# Patient Record
Sex: Male | Born: 1945 | Race: White | Hispanic: No | Marital: Single | Smoking: Never smoker
Health system: Southern US, Community
[De-identification: ages and names within clinical notes are randomized; demographics above are authoritative.]

## PROBLEM LIST (undated history)

## (undated) DIAGNOSIS — I2510 Atherosclerotic heart disease of native coronary artery without angina pectoris: Secondary | ICD-10-CM

## (undated) DIAGNOSIS — I10 Essential (primary) hypertension: Secondary | ICD-10-CM

## (undated) HISTORY — PX: CORONARY ANGIOPLASTY WITH STENT PLACEMENT: SHX49

---

## 2020-03-03 ENCOUNTER — Other Ambulatory Visit: Payer: Self-pay

## 2020-03-03 ENCOUNTER — Observation Stay (HOSPITAL_COMMUNITY): Payer: Medicare HMO

## 2020-03-03 ENCOUNTER — Encounter (HOSPITAL_COMMUNITY): Payer: Self-pay | Admitting: Emergency Medicine

## 2020-03-03 ENCOUNTER — Emergency Department (HOSPITAL_COMMUNITY): Payer: Medicare HMO

## 2020-03-03 ENCOUNTER — Inpatient Hospital Stay (HOSPITAL_COMMUNITY)
Admission: EM | Admit: 2020-03-03 | Discharge: 2020-03-08 | DRG: 069 | Disposition: A | Discharge status: Home Health | Payer: Medicare HMO | Attending: Internal Medicine | Admitting: Internal Medicine

## 2020-03-03 DIAGNOSIS — I455 Other specified heart block: Secondary | ICD-10-CM | POA: Diagnosis not present

## 2020-03-03 DIAGNOSIS — R4701 Aphasia: Secondary | ICD-10-CM

## 2020-03-03 DIAGNOSIS — Z7982 Long term (current) use of aspirin: Secondary | ICD-10-CM

## 2020-03-03 DIAGNOSIS — Z20822 Contact with and (suspected) exposure to covid-19: Secondary | ICD-10-CM | POA: Diagnosis present

## 2020-03-03 DIAGNOSIS — R29810 Facial weakness: Secondary | ICD-10-CM | POA: Diagnosis present

## 2020-03-03 DIAGNOSIS — Z7902 Long term (current) use of antithrombotics/antiplatelets: Secondary | ICD-10-CM

## 2020-03-03 DIAGNOSIS — I129 Hypertensive chronic kidney disease with stage 1 through stage 4 chronic kidney disease, or unspecified chronic kidney disease: Secondary | ICD-10-CM | POA: Diagnosis present

## 2020-03-03 DIAGNOSIS — I639 Cerebral infarction, unspecified: Secondary | ICD-10-CM | POA: Diagnosis not present

## 2020-03-03 DIAGNOSIS — G459 Transient cerebral ischemic attack, unspecified: Principal | ICD-10-CM

## 2020-03-03 DIAGNOSIS — Z79899 Other long term (current) drug therapy: Secondary | ICD-10-CM

## 2020-03-03 DIAGNOSIS — Z955 Presence of coronary angioplasty implant and graft: Secondary | ICD-10-CM

## 2020-03-03 DIAGNOSIS — R29705 NIHSS score 5: Secondary | ICD-10-CM | POA: Diagnosis present

## 2020-03-03 DIAGNOSIS — N182 Chronic kidney disease, stage 2 (mild): Secondary | ICD-10-CM | POA: Diagnosis present

## 2020-03-03 DIAGNOSIS — Z7901 Long term (current) use of anticoagulants: Secondary | ICD-10-CM

## 2020-03-03 DIAGNOSIS — R001 Bradycardia, unspecified: Secondary | ICD-10-CM | POA: Diagnosis not present

## 2020-03-03 DIAGNOSIS — E876 Hypokalemia: Secondary | ICD-10-CM | POA: Diagnosis present

## 2020-03-03 DIAGNOSIS — R569 Unspecified convulsions: Secondary | ICD-10-CM | POA: Diagnosis present

## 2020-03-03 DIAGNOSIS — R2990 Unspecified symptoms and signs involving the nervous system: Secondary | ICD-10-CM | POA: Diagnosis not present

## 2020-03-03 DIAGNOSIS — Z8673 Personal history of transient ischemic attack (TIA), and cerebral infarction without residual deficits: Secondary | ICD-10-CM

## 2020-03-03 DIAGNOSIS — F10230 Alcohol dependence with withdrawal, uncomplicated: Secondary | ICD-10-CM | POA: Diagnosis not present

## 2020-03-03 DIAGNOSIS — I2510 Atherosclerotic heart disease of native coronary artery without angina pectoris: Secondary | ICD-10-CM | POA: Diagnosis present

## 2020-03-03 DIAGNOSIS — R7989 Other specified abnormal findings of blood chemistry: Secondary | ICD-10-CM | POA: Diagnosis present

## 2020-03-03 DIAGNOSIS — R4189 Other symptoms and signs involving cognitive functions and awareness: Secondary | ICD-10-CM | POA: Diagnosis present

## 2020-03-03 DIAGNOSIS — Z789 Other specified health status: Secondary | ICD-10-CM

## 2020-03-03 DIAGNOSIS — G8191 Hemiplegia, unspecified affecting right dominant side: Secondary | ICD-10-CM | POA: Diagnosis present

## 2020-03-03 HISTORY — DX: Essential (primary) hypertension: I10

## 2020-03-03 HISTORY — DX: Atherosclerotic heart disease of native coronary artery without angina pectoris: I25.10

## 2020-03-03 LAB — COMPREHENSIVE METABOLIC PANEL
ALT: 19 U/L (ref 0–44)
AST: 23 U/L (ref 15–41)
Albumin: 4.4 g/dL (ref 3.5–5.0)
Alkaline Phosphatase: 46 U/L (ref 38–126)
Anion gap: 13 (ref 5–15)
BUN: 22 mg/dL (ref 8–23)
CO2: 19 mmol/L — ABNORMAL LOW (ref 22–32)
Calcium: 9.6 mg/dL (ref 8.9–10.3)
Chloride: 112 mmol/L — ABNORMAL HIGH (ref 98–111)
Creatinine, Ser: 1.68 mg/dL — ABNORMAL HIGH (ref 0.61–1.24)
GFR calc Af Amer: 46 mL/min — ABNORMAL LOW (ref >60)
GFR calc non Af Amer: 40 mL/min — ABNORMAL LOW (ref >60)
Glucose, Bld: 143 mg/dL — ABNORMAL HIGH (ref 70–99)
Potassium: 3.4 mmol/L — ABNORMAL LOW (ref 3.5–5.1)
Sodium: 144 mmol/L (ref 135–145)
Total Bilirubin: 1.6 mg/dL — ABNORMAL HIGH (ref 0.3–1.2)
Total Protein: 7.2 g/dL (ref 6.5–8.1)

## 2020-03-03 LAB — LIPID PANEL
Cholesterol: 123 mg/dL (ref 0–200)
HDL: 54 mg/dL (ref >40)
LDL Cholesterol: 53 mg/dL (ref 0–99)
Total CHOL/HDL Ratio: 2.3 RATIO
Triglycerides: 79 mg/dL (ref <150)
VLDL: 16 mg/dL (ref 0–40)

## 2020-03-03 LAB — ACETAMINOPHEN LEVEL: Acetaminophen (Tylenol), Serum: <10 ug/mL — ABNORMAL LOW (ref 10–30)

## 2020-03-03 LAB — DIFFERENTIAL
Abs Immature Granulocytes: 0.03 10*3/uL (ref 0.00–0.07)
Basophils Absolute: 0 10*3/uL (ref 0.0–0.1)
Basophils Relative: 0 %
Eosinophils Absolute: 0.2 10*3/uL (ref 0.0–0.5)
Eosinophils Relative: 2 %
Immature Granulocytes: 0 %
Lymphocytes Relative: 10 %
Lymphs Abs: 0.9 10*3/uL (ref 0.7–4.0)
Monocytes Absolute: 0.8 10*3/uL (ref 0.1–1.0)
Monocytes Relative: 9 %
Neutro Abs: 7 10*3/uL (ref 1.7–7.7)
Neutrophils Relative %: 79 %

## 2020-03-03 LAB — I-STAT CHEM 8, ED
BUN: 23 mg/dL (ref 8–23)
Calcium, Ion: 1.17 mmol/L (ref 1.15–1.40)
Chloride: 113 mmol/L — ABNORMAL HIGH (ref 98–111)
Creatinine, Ser: 1.7 mg/dL — ABNORMAL HIGH (ref 0.61–1.24)
Glucose, Bld: 142 mg/dL — ABNORMAL HIGH (ref 70–99)
HCT: 39 % (ref 39.0–52.0)
Hemoglobin: 13.3 g/dL (ref 13.0–17.0)
Potassium: 3.4 mmol/L — ABNORMAL LOW (ref 3.5–5.1)
Sodium: 145 mmol/L (ref 135–145)
TCO2: 21 mmol/L — ABNORMAL LOW (ref 22–32)

## 2020-03-03 LAB — SALICYLATE LEVEL: Salicylate Lvl: <7 mg/dL — ABNORMAL LOW (ref 7.0–30.0)

## 2020-03-03 LAB — CBG MONITORING, ED: Glucose-Capillary: 133 mg/dL — ABNORMAL HIGH (ref 70–99)

## 2020-03-03 LAB — CBC
HCT: 43.2 % (ref 39.0–52.0)
Hemoglobin: 14.3 g/dL (ref 13.0–17.0)
MCH: 31.8 pg (ref 26.0–34.0)
MCHC: 33.1 g/dL (ref 30.0–36.0)
MCV: 96 fL (ref 80.0–100.0)
Platelets: 127 10*3/uL — ABNORMAL LOW (ref 150–400)
RBC: 4.5 MIL/uL (ref 4.22–5.81)
RDW: 13 % (ref 11.5–15.5)
WBC: 8.9 10*3/uL (ref 4.0–10.5)
nRBC: 0 % (ref 0.0–0.2)

## 2020-03-03 LAB — ECHOCARDIOGRAM COMPLETE
Height: 69 in
Weight: 3227.53 oz

## 2020-03-03 LAB — HEMOGLOBIN A1C
Hgb A1c MFr Bld: 5.6 % (ref 4.8–5.6)
Mean Plasma Glucose: 114.02 mg/dL

## 2020-03-03 LAB — URINALYSIS, ROUTINE W REFLEX MICROSCOPIC
Bilirubin Urine: NEGATIVE
Glucose, UA: NEGATIVE mg/dL
Hgb urine dipstick: NEGATIVE
Ketones, ur: NEGATIVE mg/dL
Leukocytes,Ua: NEGATIVE
Nitrite: NEGATIVE
Protein, ur: NEGATIVE mg/dL
Specific Gravity, Urine: 1.026 (ref 1.005–1.030)
pH: 5 (ref 5.0–8.0)

## 2020-03-03 LAB — PROTIME-INR
INR: 1 (ref 0.8–1.2)
Prothrombin Time: 13.1 seconds (ref 11.4–15.2)

## 2020-03-03 LAB — RAPID URINE DRUG SCREEN, HOSP PERFORMED
Amphetamines: NOT DETECTED
Barbiturates: NOT DETECTED
Benzodiazepines: POSITIVE — AB
Cocaine: NOT DETECTED
Opiates: NOT DETECTED
Tetrahydrocannabinol: NOT DETECTED

## 2020-03-03 LAB — ETHANOL: Alcohol, Ethyl (B): <10 mg/dL (ref <10)

## 2020-03-03 LAB — SARS CORONAVIRUS 2 BY RT PCR (HOSPITAL ORDER, PERFORMED IN ~~LOC~~ HOSPITAL LAB): SARS Coronavirus 2: NEGATIVE

## 2020-03-03 LAB — APTT: aPTT: 23 seconds — ABNORMAL LOW (ref 24–36)

## 2020-03-03 MED ORDER — ACETAMINOPHEN 160 MG/5ML PO SOLN: 650.0000 mg | ORAL | Status: DC | PRN

## 2020-03-03 MED ORDER — HYDRALAZINE HCL 25 MG PO TABS
25.0000 mg | ORAL_TABLET | Freq: Four times a day (QID) | ORAL | Status: DC
  Administered 2020-03-03 – 2020-03-04 (×4): 25 mg via ORAL
  Filled 2020-03-03 (×4): qty 1

## 2020-03-03 MED ORDER — HYDRALAZINE HCL 20 MG/ML IJ SOLN
10.0000 mg | Freq: Once | INTRAMUSCULAR | Status: AC
  Administered 2020-03-03: 10 mg via INTRAVENOUS
  Filled 2020-03-03: qty 1

## 2020-03-03 MED ORDER — ATROPINE SULFATE 1 MG/10ML IJ SOSY
0.5000 mg | PREFILLED_SYRINGE | Freq: Once | INTRAMUSCULAR | Status: DC
  Filled 2020-03-03: qty 10

## 2020-03-03 MED ORDER — ACETAMINOPHEN 650 MG RE SUPP: 650.0000 mg | RECTAL | Status: DC | PRN

## 2020-03-03 MED ORDER — IOHEXOL 350 MG/ML SOLN
75.0000 mL | Freq: Once | INTRAVENOUS | Status: AC | PRN
  Administered 2020-03-03: 75 mL via INTRAVENOUS

## 2020-03-03 MED ORDER — ATROPINE SULFATE 1 MG/10ML IJ SOSY
PREFILLED_SYRINGE | INTRAMUSCULAR | Status: AC
  Filled 2020-03-03: qty 10

## 2020-03-03 MED ORDER — AMLODIPINE BESYLATE 5 MG PO TABS
5.0000 mg | ORAL_TABLET | Freq: Every day | ORAL | Status: DC
  Administered 2020-03-03 – 2020-03-08 (×7): 5 mg via ORAL
  Filled 2020-03-03 (×3): qty 1
  Filled 2020-03-03: qty 2
  Filled 2020-03-03 (×3): qty 1

## 2020-03-03 MED ORDER — ACETAMINOPHEN 325 MG PO TABS: 650.0000 mg | ORAL_TABLET | ORAL | Status: DC | PRN

## 2020-03-03 MED ORDER — ATORVASTATIN CALCIUM 40 MG PO TABS
40.0000 mg | ORAL_TABLET | Freq: Every day | ORAL | Status: DC
  Administered 2020-03-03 – 2020-03-04 (×2): 40 mg via ORAL
  Filled 2020-03-03 (×2): qty 1

## 2020-03-03 MED ORDER — LEVETIRACETAM IN NACL 1000 MG/100ML IV SOLN
1000.0000 mg | Freq: Once | INTRAVENOUS | Status: AC
  Administered 2020-03-03: 1000 mg via INTRAVENOUS
  Filled 2020-03-03: qty 100

## 2020-03-03 MED ORDER — SODIUM CHLORIDE 0.9 % IV SOLN: INTRAVENOUS | Status: AC

## 2020-03-03 MED ORDER — ASPIRIN 300 MG RE SUPP: 300.0000 mg | Freq: Every day | RECTAL | Status: DC

## 2020-03-03 MED ORDER — ENOXAPARIN SODIUM 40 MG/0.4ML ~~LOC~~ SOLN
40.0000 mg | SUBCUTANEOUS | Status: DC
  Administered 2020-03-03 – 2020-03-08 (×6): 40 mg via SUBCUTANEOUS
  Filled 2020-03-03 (×6): qty 0.4

## 2020-03-03 MED ORDER — THIAMINE HCL 100 MG/ML IJ SOLN
500.0000 mg | Freq: Three times a day (TID) | INTRAVENOUS | Status: AC
  Administered 2020-03-03 – 2020-03-04 (×5): 500 mg via INTRAVENOUS
  Filled 2020-03-03 (×7): qty 5

## 2020-03-03 MED ORDER — STROKE: EARLY STAGES OF RECOVERY BOOK
Freq: Once | Status: DC
  Filled 2020-03-03: qty 1

## 2020-03-03 MED ORDER — ASPIRIN 325 MG PO TABS
325.0000 mg | ORAL_TABLET | Freq: Every day | ORAL | Status: DC
  Administered 2020-03-03 – 2020-03-04 (×2): 325 mg via ORAL
  Filled 2020-03-03 (×2): qty 1

## 2020-03-03 MED ORDER — LEVETIRACETAM IN NACL 500 MG/100ML IV SOLN
500.0000 mg | Freq: Two times a day (BID) | INTRAVENOUS | Status: DC
  Administered 2020-03-03 – 2020-03-05 (×4): 500 mg via INTRAVENOUS
  Filled 2020-03-03 (×5): qty 100

## 2020-03-03 MED ORDER — THIAMINE HCL 100 MG/ML IJ SOLN
Freq: Once | INTRAVENOUS | Status: AC
  Filled 2020-03-03: qty 1000

## 2020-03-03 MED ORDER — GLUCAGON HCL RDNA (DIAGNOSTIC) 1 MG IJ SOLR
5.0000 mg | Freq: Once | INTRAMUSCULAR | Status: DC
  Filled 2020-03-03: qty 5

## 2020-03-03 NOTE — Progress Notes (Signed)
See note by Dr. Laurence Slate from earlier today. Presented with aphasia which has now resolved, but he still has a right facial droop. Will get repeat diffusion imaging, stroke workup underway.   Ritta Slot, MD [facility] [phone]  If 7pm- 7am, please page neurology on call as listed in AMION.

## 2020-03-03 NOTE — ED Notes (Signed)
Cardiologist at the bedside

## 2020-03-03 NOTE — ED Notes (Signed)
Pt transported to echo 
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
## 2020-03-03 NOTE — ED Notes (Signed)
PT at bedside.

## 2020-03-03 NOTE — H&P (Signed)
History and Physical    David HauserBruce Brummond ZOX:[id number] DOB: June 07, 1946 DOA: 03/03/2020  PCP: Patient, No Pcp Per  Patient coming from: Home  I have personally briefly [facility]  Chief Complaint: AMS  HPI: David Cherry is a 74 y.o. male with medical history significant of CAD s/p stent, HTN.  Pt brought in to ED after [place] [address] with confusion, aphasia, R sided weakness.  Some coumadin found in his car.  Per patient hasnt been taking med for the past month.  History from patient not really great at this point due to a mixed wernickie+broca aphasia.  For example when a word is used with the patient he will start randomly putting that word into sentences: ie "I live in coumadin".  Makes history taking very challenging.  Family doesn't know much about his medical history when ED called them.   ED Course: In ED pt seen by neurology as a code stroke, not TPAed due to unk last well.  MRI brain neg for acute stroke findings.  Per Dr. Laurence SlateAroor consider TIA vs seizure with post-ictal state.  Review of Systems: Cant really get an effective ROS due to aphasia.  Past Medical History:  Diagnosis Date  . Coronary artery disease   . Hypertension     Past Surgical History:  Procedure Laterality Date  . CORONARY ANGIOPLASTY WITH STENT PLACEMENT       reports that he has never smoked. He has never used smokeless tobacco. He reports that he does not drink alcohol or use drugs.  No Known Allergies  History reviewed. No pertinent family history.   Prior to Admission medications   Not on File    Physical Exam: Vitals:   03/03/20 0215 03/03/20 0245 03/03/20 0300 03/03/20 0315  BP: (!) 157/93 129/78 117/80 (!) 169/95  Pulse: 71 (!) 33 95 77  Resp: 13 19 19  (!) 23  Temp:      TempSrc:      SpO2: 100% 97% 96% 99%  Weight:      Height:        Constitutional: NAD, calm, comfortable Eyes: PERRL, lids and conjunctivae  normal ENMT: Mucous membranes are moist. Posterior pharynx clear of any exudate or lesions.Normal dentition.  Neck: normal, supple, no masses, no thyromegaly Respiratory: clear to auscultation bilaterally, no wheezing, no crackles. Normal respiratory effort. No accessory muscle use.  Cardiovascular: IRR, IRR with significant pauses.  Worse when pt asleep. Abdomen: no tenderness, no masses palpated. No hepatosplenomegaly. Bowel sounds positive.  Musculoskeletal: no clubbing / cyanosis. No joint deformity upper and lower extremities. Good ROM, no contractures. Normal muscle tone.  Skin: no rashes, lesions, ulcers. No induration Neurologic: R sided weakness.  Mixed broca's and wernickie's aphasia. Psychiatric: Knows hes in a hospital, oriented to self,   Labs on Admission: I have personally reviewed following labs and imaging studies  CBC: Recent Labs  Lab 03/03/20 0057 03/03/20 0102  WBC 8.9  --   NEUTROABS 7.0  --   HGB 14.3 13.3  HCT 43.2 39.0  MCV 96.0  --   PLT 127*  --    Basic Metabolic Panel: Recent Labs  Lab 03/03/20 0057 03/03/20 0102  NA 144 145  K 3.4* 3.4*  CL 112* 113*  CO2 19*  --   GLUCOSE 143* 142*  BUN 22 23  CREATININE 1.68* 1.70*  CALCIUM 9.6  --    GFR: Estimated Creatinine Clearance: 43.9 mL/min (A) (by  C-G formula based on SCr of 1.7 mg/dL (H)). Liver Function Tests: Recent Labs  Lab 03/03/20 0057  AST 23  ALT 19  ALKPHOS 46  BILITOT 1.6*  PROT 7.2  ALBUMIN 4.4   No results for input(s): LIPASE, AMYLASE in the last 168 hours. No results for input(s): AMMONIA in the last 168 hours. Coagulation Profile: Recent Labs  Lab 03/03/20 0057  INR 1.0   Cardiac Enzymes: No results for input(s): CKTOTAL, CKMB, CKMBINDEX, TROPONINI in the last 168 hours. BNP (last 3 results) No results for input(s): PROBNP in the last 8760 hours. HbA1C: No results for input(s): HGBA1C in the last 72 hours. CBG: Recent Labs  Lab 03/03/20 0053  GLUCAP 133*     Lipid Profile: No results for input(s): CHOL, HDL, LDLCALC, TRIG, CHOLHDL, LDLDIRECT in the last 72 hours. Thyroid Function Tests: No results for input(s): TSH, T4TOTAL, FREET4, T3FREE, THYROIDAB in the last 72 hours. Anemia Panel: No results for input(s): VITAMINB12, FOLATE, FERRITIN, TIBC, IRON, RETICCTPCT in the last 72 hours. Urine analysis:    Component Value Date/Time   COLORURINE YELLOW 03/03/2020 0048   APPEARANCEUR CLEAR 03/03/2020 0048   LABSPEC 1.026 03/03/2020 0048   PHURINE 5.0 03/03/2020 0048   GLUCOSEU NEGATIVE 03/03/2020 0048   HGBUR NEGATIVE 03/03/2020 0048   BILIRUBINUR NEGATIVE 03/03/2020 0048   KETONESUR NEGATIVE 03/03/2020 0048   PROTEINUR NEGATIVE 03/03/2020 0048   NITRITE NEGATIVE 03/03/2020 0048   LEUKOCYTESUR NEGATIVE 03/03/2020 0048    Radiological Exams on Admission: CT ANGIO HEAD W OR WO CONTRAST  Result Date: 03/03/2020 CLINICAL DATA:  Right-sided weakness EXAM: CT ANGIOGRAPHY HEAD AND NECK TECHNIQUE: Multidetector CT imaging of the head and neck was performed using the standard protocol during bolus administration of intravenous contrast. Multiplanar CT image reconstructions and MIPs were obtained to evaluate the vascular anatomy. Carotid stenosis measurements (when applicable) are obtained utilizing NASCET criteria, using the distal internal carotid diameter as the denominator. CONTRAST:  109mL OMNIPAQUE IOHEXOL 350 MG/ML SOLN COMPARISON:  None. FINDINGS: CTA NECK FINDINGS SKELETON: There is no bony spinal canal stenosis. No lytic or blastic lesion. OTHER NECK: Normal pharynx, larynx and major salivary glands. No cervical lymphadenopathy. Unremarkable thyroid gland. UPPER CHEST: No pneumothorax or pleural effusion. No nodules or masses. AORTIC ARCH: There is no calcific atherosclerosis of the aortic arch. There is no aneurysm, dissection or hemodynamically significant stenosis of the visualized portion of the aorta. Conventional 3 vessel aortic branching  pattern. The visualized proximal subclavian arteries are widely patent. RIGHT CAROTID SYSTEM: No dissection, occlusion or aneurysm. Mild atherosclerotic calcification at the carotid bifurcation without hemodynamically significant stenosis. LEFT CAROTID SYSTEM: No dissection, occlusion or aneurysm. Mild atherosclerotic calcification at the carotid bifurcation without hemodynamically significant stenosis. VERTEBRAL ARTERIES: Codominant configuration. Both origins are clearly patent. There is no dissection, occlusion or flow-limiting stenosis to the skull base (V1-V3 segments). CTA HEAD FINDINGS POSTERIOR CIRCULATION: --Vertebral arteries: Normal V4 segments. --Inferior cerebellar arteries: Normal. --Basilar artery: Normal. --Superior cerebellar arteries: Normal. --Posterior cerebral arteries (PCA): Normal. ANTERIOR CIRCULATION: --Intracranial internal carotid arteries: Atherosclerotic calcification of the internal carotid arteries at the skull base without hemodynamically significant stenosis. --Anterior cerebral arteries (ACA): Normal. Absent right A1 segment, normal variant --Middle cerebral arteries (MCA): Normal. VENOUS SINUSES: As permitted by contrast timing, patent. ANATOMIC VARIANTS: None Review of the MIP images confirms the above findings. IMPRESSION: 1. No emergent large vessel occlusion or hemodynamically significant stenosis of the head or neck. 2. Mild bilateral carotid bifurcation atherosclerosis without hemodynamically significant stenosis. Electronically  Signed   By: Ulyses Jarred M.D.   On: 03/03/2020 01:30   CT ANGIO NECK W OR WO CONTRAST  Result Date: 03/03/2020 CLINICAL DATA:  Right-sided weakness EXAM: CT ANGIOGRAPHY HEAD AND NECK TECHNIQUE: Multidetector CT imaging of the head and neck was performed using the standard protocol during bolus administration of intravenous contrast. Multiplanar CT image reconstructions and MIPs were obtained to evaluate the vascular anatomy. Carotid stenosis  measurements (when applicable) are obtained utilizing NASCET criteria, using the distal internal carotid diameter as the denominator. CONTRAST:  24mL OMNIPAQUE IOHEXOL 350 MG/ML SOLN COMPARISON:  None. FINDINGS: CTA NECK FINDINGS SKELETON: There is no bony spinal canal stenosis. No lytic or blastic lesion. OTHER NECK: Normal pharynx, larynx and major salivary glands. No cervical lymphadenopathy. Unremarkable thyroid gland. UPPER CHEST: No pneumothorax or pleural effusion. No nodules or masses. AORTIC ARCH: There is no calcific atherosclerosis of the aortic arch. There is no aneurysm, dissection or hemodynamically significant stenosis of the visualized portion of the aorta. Conventional 3 vessel aortic branching pattern. The visualized proximal subclavian arteries are widely patent. RIGHT CAROTID SYSTEM: No dissection, occlusion or aneurysm. Mild atherosclerotic calcification at the carotid bifurcation without hemodynamically significant stenosis. LEFT CAROTID SYSTEM: No dissection, occlusion or aneurysm. Mild atherosclerotic calcification at the carotid bifurcation without hemodynamically significant stenosis. VERTEBRAL ARTERIES: Codominant configuration. Both origins are clearly patent. There is no dissection, occlusion or flow-limiting stenosis to the skull base (V1-V3 segments). CTA HEAD FINDINGS POSTERIOR CIRCULATION: --Vertebral arteries: Normal V4 segments. --Inferior cerebellar arteries: Normal. --Basilar artery: Normal. --Superior cerebellar arteries: Normal. --Posterior cerebral arteries (PCA): Normal. ANTERIOR CIRCULATION: --Intracranial internal carotid arteries: Atherosclerotic calcification of the internal carotid arteries at the skull base without hemodynamically significant stenosis. --Anterior cerebral arteries (ACA): Normal. Absent right A1 segment, normal variant --Middle cerebral arteries (MCA): Normal. VENOUS SINUSES: As permitted by contrast timing, patent. ANATOMIC VARIANTS: None Review of the  MIP images confirms the above findings. IMPRESSION: 1. No emergent large vessel occlusion or hemodynamically significant stenosis of the head or neck. 2. Mild bilateral carotid bifurcation atherosclerosis without hemodynamically significant stenosis. Electronically Signed   By: Ulyses Jarred M.D.   On: 03/03/2020 01:30   MR BRAIN WO CONTRAST  Result Date: 03/03/2020 CLINICAL DATA:  Dysphagia and right-sided weakness EXAM: MRI HEAD WITHOUT CONTRAST TECHNIQUE: Multiplanar, multiecho pulse sequences of the brain and surrounding structures were obtained without intravenous contrast. COMPARISON:  CTA head neck same day FINDINGS: BRAIN: No acute infarct, acute hemorrhage or extra-axial collection. Multifocal white matter hyperintensity, most commonly due to chronic ischemic microangiopathy. Multiple old small vessel infarcts of the radiata. Normal volume of CSF spaces. No chronic microhemorrhage. Normal midline structures. VASCULAR: Major flow voids are preserved. SKULL AND UPPER CERVICAL SPINE: Normal calvarium and skull base. Visualized upper cervical spine and soft tissues are normal. SINUSES/ORBITS: Right mastoid fluid. Paranasal sinuses are clear. Normal orbits. IMPRESSION: 1. No acute intracranial process. 2. 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 Electronically Signed   By: Ulyses Jarred M.D.   On: 03/03/2020 02:15   DG Chest Portable 1 View  Result Date: 03/03/2020 CLINICAL DATA:  Altered mental status EXAM: PORTABLE CHEST 1 VIEW COMPARISON:  None. FINDINGS: No focal consolidation, features of edema, pneumothorax, or effusion. The aorta is calcified. The remaining cardiomediastinal contours are unremarkable. No acute osseous or soft tissue abnormality. Degenerative changes are present in the imaged spine and shoulders. Telemetry leads overlie the chest. IMPRESSION: No acute cardiopulmonary abnormality. Electronically Signed  By: Kreg Shropshire M.D.   On:  03/03/2020 02:36   CT HEAD CODE STROKE WO CONTRAST  Result Date: 03/03/2020 CLINICAL DATA:  Code stroke.  Right-sided weakness and aphasia EXAM: CT HEAD WITHOUT CONTRAST TECHNIQUE: Contiguous axial images were obtained from the base of the skull through the vertex without intravenous contrast. COMPARISON:  None. FINDINGS: Brain: There is no mass, hemorrhage or extra-axial collection. There is generalized atrophy without lobar predilection. There is hypoattenuation of the periventricular white matter, most commonly indicating chronic ischemic microangiopathy. 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 Vascular: No abnormal hyperdensity of the major intracranial arteries or dural venous sinuses. No intracranial atherosclerosis. Skull: The visualized skull base, calvarium and extracranial soft tissues are normal. Sinuses/Orbits: No fluid levels or advanced mucosal thickening of the visualized paranasal sinuses. No mastoid or middle ear effusion. The orbits are normal. ASPECTS [facility] Stroke Program Early CT Score) - Ganglionic level infarction (caudate, lentiform nuclei, internal capsule, insula, M1-M3 cortex): 7 - Supraganglionic infarction (M4-M6 cortex): 3 Total score (0-10 with 10 being normal): 10 IMPRESSION: 1. No acute hemorrhage. 2. Possible subacute left cerebellar infarct. 3. ASPECTS is 10. These results were communicated to Dr. Georgiana Spinner Aroor at 1:04 am on 03/03/2020 by text page via the [facility] messaging system. Electronically Signed   By: Deatra Robinson M.D.   On: 03/03/2020 01:04    EKG: Independently reviewed.  Assessment/Plan Principal Problem:   Stroke-like symptoms Active Problems:   CAD (coronary artery disease)   Chronic anticoagulation   Bradycardia    1. Stroke like symptoms - 1. Stroke / TIA pathway 2. See neuro consult note 3. 2d echo 4. Tele monitor 5. PT/OT/SLP 6. Start Lipitor 40 (either for TIA, or for the h/o CAD s/p stent). 7. ASA 325 8. Per neuro: start empiric keppra  since seizure is a possibility. 9. No reported h/o EtOH abuse, but will also do high dose thiamine, just-in-case there is an element of wernickie-korsakoff that we are missing: so 500mg  thiamine IV TID for 2 days ordered. 2. Bradycardia / sinus pauses - 1. Gets very severe when patient is asleep, seems less severe when we woke patient up. 2. HR will drop into the 30s while asleep 3. Unknown what meds he is on chronically, BB? Etc. 4. Though when not having pauses will have HR almost up to 100. 5. Dr. going to see pt. 3. Chronic anticoagulation - 1. Sounds like he was on coumadin, for some reason, up until about 1 month ago when he stopped taking coumadin, unclear why this was stopped. 2. INR 1.0 today 4. CAD - 1. Pt denies chest pain today 2. Med rec still pending. 3. ASA 325 for stroke 4. Lipitor 40 being started  DVT prophylaxis: Lovenox Code Status: Full Family Communication: No family in room Disposition Plan: TBD Consults called: Dr. Algie Coffer, Dr. Laurence Slate Admission status: Place in obs    Skylie Hiott M. DO [facility]  How to contact the [facility] Attending or Consulting provider 7A - 7P or covering provider during after hours 7P -7A, for this patient?  1. Check the care team in [facility] and look for a) attending/consulting [facility] provider listed and b) the [facility] team listed 2. Log into [email or web address]  Amion Physician Scheduling and messaging for groups and whole hospitals  On call and physician scheduling software for group practices, residents, hospitalists and other medical providers for call, clinic, rotation and shift schedules. [employer] is a hospital-wide system for scheduling doctors and paging doctors on call.  EasyPlot is for scientific plotting and data analysis.  [email or web address]  and use [facility]'s universal password to access. If you do not have the password, please contact the hospital operator.  3. Locate the [facility] provider you are looking for under [facility] and page to a number that you can be directly reached. 4. If you still have difficulty reaching the provider, please page the [facility] (Director on Call) for the Hospitalists listed on amion for assistance.  03/03/2020, 3:32 AM

## 2020-03-03 NOTE — Progress Notes (Signed)
Good LV systolic function. VS are stable with improved heart rate and blood pressure controlled. Patient wants to see his Cardiologist in [place], [place] area. Mild confusion persist. Increase activity in AM for possible discharge.  Orpah Cobb, MD.

## 2020-03-03 NOTE — ED Notes (Signed)
Pt transported to MRI 

## 2020-03-03 NOTE — ED Provider Notes (Addendum)
[facility] EMERGENCY DEPARTMENT Provider Note   CSN: [id number] Arrival date & time: 03/03/20  0050     History Chief Complaint  Patient presents with  . Code Stroke    David Cherry is a 74 y.o. male.  The history is provided by the EMS personnel. The history is limited by the condition of the patient.  Cerebrovascular Accident This is a new problem. Episode onset: unknown. The problem occurs constantly. The problem has not changed since onset.Nothing aggravates the symptoms. Nothing relieves the symptoms. He has tried nothing for the symptoms. The treatment provided moderate relief.  Found on side of 87 with aphasia and confusion and right facial droop.       History reviewed. No pertinent past medical history.  There are no problems to display for this patient.   History reviewed. No pertinent surgical history.     History reviewed. No pertinent family history.  Social History   Tobacco Use  . Smoking status: Not on file  Substance Use Topics  . Alcohol use: Not on file  . Drug use: Not on file    Home Medications Prior to Admission medications   Not on File    Allergies    Patient has no allergy information on record.  Review of Systems   Review of Systems  Unable to perform ROS: Acuity of condition  Constitutional: Negative for fever.  Neurological: Positive for facial asymmetry and speech difficulty.    Physical Exam Updated Vital Signs There were no vitals taken for this visit.  Physical Exam Vitals and nursing note reviewed.  Constitutional:      General: He is not in acute distress.    Appearance: Normal appearance.  HENT:     Head: Normocephalic and atraumatic.     Nose: Nose normal.     Mouth/Throat:     Mouth: Mucous membranes are moist.     Pharynx: Oropharynx is clear.  Eyes:     Extraocular Movements: Extraocular movements intact.     Conjunctiva/sclera: Conjunctivae normal.     Pupils: Pupils are equal,  round, and reactive to light.  Cardiovascular:     Rate and Rhythm: Normal rate and regular rhythm.     Pulses: Normal pulses.     Heart sounds: Normal heart sounds.  Pulmonary:     Effort: Pulmonary effort is normal.     Breath sounds: Normal breath sounds.  Abdominal:     General: Abdomen is flat. Bowel sounds are normal.     Tenderness: There is no abdominal tenderness. There is no guarding.  Musculoskeletal:        General: Normal range of motion.     Cervical back: Normal range of motion and neck supple.  Skin:    General: Skin is warm and dry.     Capillary Refill: Capillary refill takes less than 2 seconds.  Neurological:     Mental Status: He is alert.     Deep Tendon Reflexes: Reflexes normal.     Comments: Expressive aphasia mild right mouth droop     ED Results / Procedures / Treatments   Labs (all labs ordered are listed, but only abnormal results are displayed) Results for orders placed or performed during the hospital encounter of 03/03/20  SARS Coronavirus 2 by RT PCR (hospital order, performed in [facility] hospital lab) Nasopharyngeal Nasopharyngeal Swab   Specimen: Nasopharyngeal Swab  Result Value Ref Range   SARS Coronavirus 2 NEGATIVE NEGATIVE  Ethanol  Result  Value Ref Range   Alcohol, Ethyl (B) <10 <10 mg/dL  Protime-INR  Result Value Ref Range   Prothrombin Time 13.1 11.4 - 15.2 seconds   INR 1.0 0.8 - 1.2  APTT  Result Value Ref Range   aPTT 23 (L) 24 - 36 seconds  CBC  Result Value Ref Range   WBC 8.9 4.0 - 10.5 K/uL   RBC 4.50 4.22 - 5.81 MIL/uL   Hemoglobin 14.3 13.0 - 17.0 g/dL   HCT 30.1 60.1 - 09.3 %   MCV 96.0 80.0 - 100.0 fL   MCH 31.8 26.0 - 34.0 pg   MCHC 33.1 30.0 - 36.0 g/dL   RDW 23.5 57.3 - 22.0 %   Platelets 127 (L) 150 - 400 K/uL   nRBC 0.0 0.0 - 0.2 %  Differential  Result Value Ref Range   Neutrophils Relative % 79 %   Neutro Abs 7.0 1.7 - 7.7 K/uL   Lymphocytes Relative 10 %   Lymphs Abs 0.9 0.7 - 4.0 K/uL    Monocytes Relative 9 %   Monocytes Absolute 0.8 0.1 - 1.0 K/uL   Eosinophils Relative 2 %   Eosinophils Absolute 0.2 0.0 - 0.5 K/uL   Basophils Relative 0 %   Basophils Absolute 0.0 0.0 - 0.1 K/uL   Immature Granulocytes 0 %   Abs Immature Granulocytes 0.03 0.00 - 0.07 K/uL  Comprehensive metabolic panel  Result Value Ref Range   Sodium 144 135 - 145 mmol/L   Potassium 3.4 (L) 3.5 - 5.1 mmol/L   Chloride 112 (H) 98 - 111 mmol/L   CO2 19 (L) 22 - 32 mmol/L   Glucose, Bld 143 (H) 70 - 99 mg/dL   BUN 22 8 - 23 mg/dL   Creatinine, Ser 2.54 (H) 0.61 - 1.24 mg/dL   Calcium 9.6 8.9 - 27.0 mg/dL   Total Protein 7.2 6.5 - 8.1 g/dL   Albumin 4.4 3.5 - 5.0 g/dL   AST 23 15 - 41 U/L   ALT 19 0 - 44 U/L   Alkaline Phosphatase 46 38 - 126 U/L   Total Bilirubin 1.6 (H) 0.3 - 1.2 mg/dL   GFR calc non Af Amer 40 (L) >60 mL/min   GFR calc Af Amer 46 (L) >60 mL/min   Anion gap 13 5 - 15  Urinalysis, Routine w reflex microscopic  Result Value Ref Range   Color, Urine YELLOW YELLOW   APPearance CLEAR CLEAR   Specific Gravity, Urine 1.026 1.005 - 1.030   pH 5.0 5.0 - 8.0   Glucose, UA NEGATIVE NEGATIVE mg/dL   Hgb urine dipstick NEGATIVE NEGATIVE   Bilirubin Urine NEGATIVE NEGATIVE   Ketones, ur NEGATIVE NEGATIVE mg/dL   Protein, ur NEGATIVE NEGATIVE mg/dL   Nitrite NEGATIVE NEGATIVE   Leukocytes,Ua NEGATIVE NEGATIVE  Acetaminophen level  Result Value Ref Range   Acetaminophen (Tylenol), Serum <10 (L) 10 - 30 ug/mL  Salicylate level  Result Value Ref Range   Salicylate Lvl <7.0 (L) 7.0 - 30.0 mg/dL  I-stat chem 8, ED  Result Value Ref Range   Sodium 145 135 - 145 mmol/L   Potassium 3.4 (L) 3.5 - 5.1 mmol/L   Chloride 113 (H) 98 - 111 mmol/L   BUN 23 8 - 23 mg/dL   Creatinine, Ser 6.23 (H) 0.61 - 1.24 mg/dL   Glucose, Bld 762 (H) 70 - 99 mg/dL   Calcium, Ion 8.31 5.17 - 1.40 mmol/L   TCO2 21 (L) 22 - 32 mmol/L  Hemoglobin 13.3 13.0 - 17.0 g/dL   HCT 53.2 99.2 - 42.6 %  CBG  monitoring, ED  Result Value Ref Range   Glucose-Capillary 133 (H) 70 - 99 mg/dL   Comment 1 Notify RN    Comment 2 Document in Chart    CT ANGIO HEAD W OR WO CONTRAST  Result Date: 03/03/2020 CLINICAL DATA:  Right-sided weakness EXAM: CT ANGIOGRAPHY HEAD AND NECK TECHNIQUE: Multidetector CT imaging of the head and neck was performed using the standard protocol during bolus administration of intravenous contrast. Multiplanar CT image reconstructions and MIPs were obtained to evaluate the vascular anatomy. Carotid stenosis measurements (when applicable) are obtained utilizing NASCET criteria, using the distal internal carotid diameter as the denominator. CONTRAST:  1mL OMNIPAQUE IOHEXOL 350 MG/ML SOLN COMPARISON:  None. FINDINGS: CTA NECK FINDINGS SKELETON: There is no bony spinal canal stenosis. No lytic or blastic lesion. OTHER NECK: Normal pharynx, larynx and major salivary glands. No cervical lymphadenopathy. Unremarkable thyroid gland. UPPER CHEST: No pneumothorax or pleural effusion. No nodules or masses. AORTIC ARCH: There is no calcific atherosclerosis of the aortic arch. There is no aneurysm, dissection or hemodynamically significant stenosis of the visualized portion of the aorta. Conventional 3 vessel aortic branching pattern. The visualized proximal subclavian arteries are widely patent. RIGHT CAROTID SYSTEM: No dissection, occlusion or aneurysm. Mild atherosclerotic calcification at the carotid bifurcation without hemodynamically significant stenosis. LEFT CAROTID SYSTEM: No dissection, occlusion or aneurysm. Mild atherosclerotic calcification at the carotid bifurcation without hemodynamically significant stenosis. VERTEBRAL ARTERIES: Codominant configuration. Both origins are clearly patent. There is no dissection, occlusion or flow-limiting stenosis to the skull base (V1-V3 segments). CTA HEAD FINDINGS POSTERIOR CIRCULATION: --Vertebral arteries: Normal V4 segments. --Inferior cerebellar  arteries: Normal. --Basilar artery: Normal. --Superior cerebellar arteries: Normal. --Posterior cerebral arteries (PCA): Normal. ANTERIOR CIRCULATION: --Intracranial internal carotid arteries: Atherosclerotic calcification of the internal carotid arteries at the skull base without hemodynamically significant stenosis. --Anterior cerebral arteries (ACA): Normal. Absent right A1 segment, normal variant --Middle cerebral arteries (MCA): Normal. VENOUS SINUSES: As permitted by contrast timing, patent. ANATOMIC VARIANTS: None Review of the MIP images confirms the above findings. IMPRESSION: 1. No emergent large vessel occlusion or hemodynamically significant stenosis of the head or neck. 2. Mild bilateral carotid bifurcation atherosclerosis without hemodynamically significant stenosis. Electronically Signed   By: Deatra Robinson M.D.   On: 03/03/2020 01:30   CT ANGIO NECK W OR WO CONTRAST  Result Date: 03/03/2020 CLINICAL DATA:  Right-sided weakness EXAM: CT ANGIOGRAPHY HEAD AND NECK TECHNIQUE: Multidetector CT imaging of the head and neck was performed using the standard protocol during bolus administration of intravenous contrast. Multiplanar CT image reconstructions and MIPs were obtained to evaluate the vascular anatomy. Carotid stenosis measurements (when applicable) are obtained utilizing NASCET criteria, using the distal internal carotid diameter as the denominator. CONTRAST:  18mL OMNIPAQUE IOHEXOL 350 MG/ML SOLN COMPARISON:  None. FINDINGS: CTA NECK FINDINGS SKELETON: There is no bony spinal canal stenosis. No lytic or blastic lesion. OTHER NECK: Normal pharynx, larynx and major salivary glands. No cervical lymphadenopathy. Unremarkable thyroid gland. UPPER CHEST: No pneumothorax or pleural effusion. No nodules or masses. AORTIC ARCH: There is no calcific atherosclerosis of the aortic arch. There is no aneurysm, dissection or hemodynamically significant stenosis of the visualized portion of the aorta.  Conventional 3 vessel aortic branching pattern. The visualized proximal subclavian arteries are widely patent. RIGHT CAROTID SYSTEM: No dissection, occlusion or aneurysm. Mild atherosclerotic calcification at the carotid bifurcation without hemodynamically significant stenosis. LEFT CAROTID SYSTEM:  No dissection, occlusion or aneurysm. Mild atherosclerotic calcification at the carotid bifurcation without hemodynamically significant stenosis. VERTEBRAL ARTERIES: Codominant configuration. Both origins are clearly patent. There is no dissection, occlusion or flow-limiting stenosis to the skull base (V1-V3 segments). CTA HEAD FINDINGS POSTERIOR CIRCULATION: --Vertebral arteries: Normal V4 segments. --Inferior cerebellar arteries: Normal. --Basilar artery: Normal. --Superior cerebellar arteries: Normal. --Posterior cerebral arteries (PCA): Normal. ANTERIOR CIRCULATION: --Intracranial internal carotid arteries: Atherosclerotic calcification of the internal carotid arteries at the skull base without hemodynamically significant stenosis. --Anterior cerebral arteries (ACA): Normal. Absent right A1 segment, normal variant --Middle cerebral arteries (MCA): Normal. VENOUS SINUSES: As permitted by contrast timing, patent. ANATOMIC VARIANTS: None Review of the MIP images confirms the above findings. IMPRESSION: 1. No emergent large vessel occlusion or hemodynamically significant stenosis of the head or neck. 2. Mild bilateral carotid bifurcation atherosclerosis without hemodynamically significant stenosis. Electronically Signed   By: Deatra RobinsonKevin  Herman M.D.   On: 03/03/2020 01:30   MR BRAIN WO CONTRAST  Result Date: 03/03/2020 CLINICAL DATA:  Dysphagia and right-sided weakness EXAM: MRI HEAD WITHOUT CONTRAST TECHNIQUE: Multiplanar, multiecho pulse sequences of the brain and surrounding structures were obtained without intravenous contrast. COMPARISON:  CTA head neck same day FINDINGS: BRAIN: No acute infarct, acute hemorrhage or  extra-axial collection. Multifocal white matter hyperintensity, most commonly due to chronic ischemic microangiopathy. Multiple old small vessel infarcts of the radiata. Normal volume of CSF spaces. No chronic microhemorrhage. Normal midline structures. VASCULAR: Major flow voids are preserved. SKULL AND UPPER CERVICAL SPINE: Normal calvarium and skull base. Visualized upper cervical spine and soft tissues are normal. SINUSES/ORBITS: Right mastoid fluid. Paranasal sinuses are clear. Normal orbits. IMPRESSION: 1. No acute intracranial process. 2. 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 Electronically Signed   By: Deatra RobinsonKevin  Herman M.D.   On: 03/03/2020 02:15   CT HEAD CODE STROKE WO CONTRAST  Result Date: 03/03/2020 CLINICAL DATA:  Code stroke.  Right-sided weakness and aphasia EXAM: CT HEAD WITHOUT CONTRAST TECHNIQUE: Contiguous axial images were obtained from the base of the skull through the vertex without intravenous contrast. COMPARISON:  None. FINDINGS: Brain: There is no mass, hemorrhage or extra-axial collection. There is generalized atrophy without lobar predilection. There is hypoattenuation of the periventricular white matter, most commonly indicating chronic ischemic microangiopathy. 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 Vascular: No abnormal hyperdensity of the major intracranial arteries or dural venous sinuses. No intracranial atherosclerosis. Skull: The visualized skull base, calvarium and extracranial soft tissues are normal. Sinuses/Orbits: No fluid levels or advanced mucosal thickening of the visualized paranasal sinuses. No mastoid or middle ear effusion. The orbits are normal. ASPECTS [facility]([place] Stroke Program Early CT Score) - Ganglionic level infarction (caudate, lentiform nuclei, internal capsule, insula, M1-M3 cortex): 7 - Supraganglionic infarction (M4-M6 cortex): 3 Total score (0-10 with 10 being normal): 10 IMPRESSION: 1. No acute hemorrhage.  2. Possible subacute left cerebellar infarct. 3. ASPECTS is 10. These results were communicated to Dr. Georgiana SpinnerSushanth Aroor at 1:04 am on 03/03/2020 by text page via the [facility] messaging system. Electronically Signed   By: Deatra RobinsonKevin  Herman M.D.   On: 03/03/2020 01:04    EKG See read in MUse and epic  No results found.  Procedures Procedures (including critical care time)   ED Course  I have reviewed the triage vital signs and the nursing notes.  Pertinent labs & imaging results that were available during my care of the patient were reviewed by me and considered in my medical decision making (  see chart for details).    Case d/w Dr. Laurence Slate of neurology.  As symptoms are improving likely a TIA, admit to medicine.     Sinus pause while asleep.  Patient awoken and HR improved as did pauses,    Case D/w Dr. Algie Coffer who will see the patient   Final Clinical Impression(s) / ED Diagnoses Will admit to medicine    Ventura Leggitt, MD 03/03/20 2956    Nicanor Alcon, Marvia Troost, MD 03/03/20 2130

## 2020-03-03 NOTE — Progress Notes (Signed)
03/03/20 1807  PT Visit Information  Assistance Needed +1  History of Present Illness Pt is a 74 year old man admitted on 03/03/20 after [place] confused with R side weakness, facial droop and aphasia. 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 PMH: HTN, CAD   Precautions  Precautions Fall  Restrictions  Weight Bearing Restrictions No  Home Living  Family/patient expects to be discharged to: Private residence  Living Arrangements Alone  Available Help at Discharge Other (Comment) (reports no one)  Type of Home Other(Comment) (condo)  Home Access Level entry  Home Layout One level  Community education officer - 2 wheels;Cane - single point  Prior Function  Level of Independence Independent with assistive device(s)  Comments Used cane or RW for mobility   Communication  Communication No difficulties  Pain Assessment  Pain Assessment Faces  Faces Pain Scale 0  Cognition  Arousal/Alertness Awake/alert  Behavior During Therapy WFL for tasks assessed/performed  Overall Cognitive Status Impaired/Different from baseline  Area of Impairment Orientation;Attention;Memory;Following commands;Safety/judgement;Awareness;Problem solving  Orientation Level Disoriented to;Place;Time;Situation (aware he was found by police along highway)  Current Attention Level Focused  Memory Decreased short-term memory  Following Commands Follows one step commands with increased time;Follows multi-step commands inconsistently  Safety/Judgement Decreased awareness of safety;Decreased awareness of deficits  Awareness Intellectual  Problem Solving Decreased initiation;Requires verbal cues  General Comments [facility] Very tangential. Difficulty finding words.   Upper Extremity Assessment  Upper Extremity Assessment Defer to OT evaluation  Lower Extremity  Assessment  Lower Extremity Assessment Generalized weakness  Cervical / Trunk Assessment  Cervical / Trunk Assessment Kyphotic  Bed Mobility  Overal bed mobility Needs Assistance  Bed Mobility Supine to Sit;Sit to Supine  Supine to sit Min assist;HOB elevated  Sit to supine Supervision  General bed mobility comments Min A for trunk assist. Increased time required.   Transfers  Overall transfer level Needs assistance  Equipment used Rolling walker (2 wheeled)  Transfers Sit to/from Stand  Sit to Stand Min guard  General transfer comment pulls up on walker, min guard for safety  Ambulation/Gait  Ambulation/Gait assistance Min assist;Min guard  Gait Distance (Feet) 75 Feet  Assistive device Rolling walker (2 wheeled)  Gait Pattern/deviations Step-through pattern;Decreased stride length  General Gait Details Unsteady gait with notable functional weakness. Pt requiring safety cues for RW use and cues for proximity to device.   Gait velocity Decreased  Balance  Overall balance assessment Needs assistance  Sitting-balance support No upper extremity supported  Sitting balance-Leahy Scale Good  Standing balance support Bilateral upper extremity supported;During functional activity  Standing balance-Leahy Scale Poor  Standing balance comment reliant on B UEs  PT - End of Session  Equipment Utilized During Treatment Gait belt  Activity Tolerance Patient tolerated treatment well  Patient left in bed;with call bell/phone within reach  Nurse Communication Mobility status  PT Assessment  PT Recommendation/Assessment Patient needs continued PT services  PT Visit Diagnosis Unsteadiness on feet (R26.81);Muscle weakness (generalized) (M62.81)  PT Problem List Decreased strength;Decreased balance;Decreased mobility;Decreased cognition;Decreased knowledge of use of DME;Decreased safety awareness;Decreased knowledge of precautions  Barriers to Discharge Decreased caregiver support  PT Plan  PT  Frequency (ACUTE ONLY) Min 2X/week  PT Treatment/Interventions (ACUTE ONLY) Gait training;DME instruction;Therapeutic activities;Functional mobility training;Therapeutic exercise;Balance training;Patient/family education;Cognitive remediation  AM-PAC PT "6 Clicks" Mobility Outcome Measure (Version 2)  Help needed turning from your back to your side while in a flat bed without using bedrails? 3  Help needed moving from lying on your back to sitting on the side of a flat bed without using bedrails? 3  Help needed moving to and from a bed to a chair (including a wheelchair)? 3  Help needed standing up from a chair using your arms (e.g., wheelchair or bedside chair)? 3  Help needed to walk in hospital room? 3  Help needed climbing 3-5 steps with a railing?  2  6 Click Score 17  Consider Recommendation of Discharge To: Home with [facility]  PT Recommendation  Follow Up Recommendations SNF;Supervision/Assistance - 24 hour  PT equipment None recommended by PT  Individuals Consulted  Consulted and Agree with Results and Recommendations Patient  Acute Rehab PT Goals  Patient Stated Goal go home and feed his cat, "Max"  PT Goal Formulation With patient  Time For Goal Achievement 03/17/20  Potential to Achieve Goals Good  PT Time Calculation  PT Start Time (ACUTE ONLY) 1347  PT Stop Time (ACUTE ONLY) 1402  PT Time Calculation (min) (ACUTE ONLY) 15 min  PT General Charges  $$ ACUTE PT VISIT 1 Visit  PT Evaluation  $PT Eval Moderate Complexity 1 Mod  Written Expression  Dominant Hand Right   Pt admitted secondary to problem above with deficits above. Pt with increased confusion and word finding difficulty. Also with poor safety awareness. Requires min A for gait with use of RW. Required safety cues throughout. Pt currently lives alone and feel he is at increased risk for falls. Recommend SNF level therapies at d/c. Will continue to follow acutely to maximize functional mobility independence and safety.    Reuel Derby, PT, DPT  Acute Rehabilitation Services  Pager: [phone] Office: [phone]

## 2020-03-03 NOTE — ED Notes (Signed)
Paged Dr Santiago Glad to Dr Karel Jarvis

## 2020-03-03 NOTE — Progress Notes (Signed)
PROGRESS NOTE    David Cherry  FOY:[id number] DOB: [birth year] DOA: 03/03/2020 PCP: Patient, No Pcp Per    Chief Complaint  Patient presents with   Code Stroke    Brief Narrative:  HPI per Dr. Ronnie Derby is a 74 y.o. male with medical history significant of CAD s/p stent, HTN.  Pt brought in [place] ED [place] with confusion, aphasia, R sided weakness.  Some coumadin found in his car.  Per patient hasnt been taking med for the past month.  History from patient not really great at this point due to a mixed wernickie+broca aphasia.  For example when a word is used with the patient he will start randomly putting that word into sentences: ie "I live in coumadin".  Makes history taking very challenging.  Family doesn't know much about his medical history when ED called them.   ED Course: In ED pt seen by neurology as a code stroke, not TPAed due to unk last well.  MRI brain neg for acute stroke findings.  Per Dr. Laurence Slate consider TIA vs seizure with post-ictal state.  Assessment & Plan:   Principal Problem:   Stroke-like symptoms Active Problems:   CAD (coronary artery disease)   Chronic anticoagulation   Bradycardia  1 rule out CVA/strokelike symptoms Patient presenting with right-sided weakness, right facial droop and noted to be aphasic.  Patient with improvement with aphasia as well as facial droop.  Head CT done on admission negative for any acute abnormalities.  CT angiogram head and neck which was done was negative for any large vessel occlusion.  tPA not given as patient unclear as to last known normal.  MRI which was done in intracranial process, multiple old small vessel infarcts and findings of mild chronic small vessel disease noted.  2D echo ordered with EF of 55 to 60%, no wall motion abnormalities, grade 1 diastolic dysfunction, no source of emboli noted.  2D echo however did note a grade 2 atheroma plaque involving the  aortic root and ascending aorta.  Fasting lipid panel with LDL of 53.  Neurology consulted and following.  Patient loaded with Keppra due to concerns for possible seizure versus post ictal state.  EEG also ordered and done which showed a normal study with no seizures or epileptiform discharges seen throughout the recording.  Continue aspirin, statin.  Permissive hypertension.  PT/OT/ST.  Continue IV thiamine 500 mg every 8 hours x2 days.  Neurology followed up later on and recommending repeat diffusion imaging.  Appreciate neurology input and recommendations.  Follow.  2.  Bradycardia/sinus pauses Patient noted on presentation to the ED to have some severe bradycardia when asleep and less severe when patient was awake.  It was noted her heart rate would drop into the 30s while patient was asleep.  Unknown as to what specific medications patient is on in terms of a nodal blocking agents.  It was noted that when patient not having pauses heart rate went almost up to 100.  Cardiology consulted and patient seen in consultation by Dr. Algie Coffer.  2D echo was ordered with normal EF of 55 to 60%, no wall motion abnormalities, grade 1 diastolic dysfunction.  Per cardiology.  3.  Hypertension Continue Norvasc and hydralazine.  4.  Chronic anticoagulation/coronary artery disease 2D echo done with a EF of 55 to 60%, no wall motion abnormalities, grade 1 diastolic dysfunction.  Patient denies any ongoing chest pain.  It was noted that patient was  on anticoagulation with Coumadin for up until a month ago however noted to have stopped taking Coumadin.  INR at 1.0.  Continue aspirin, Lipitor, hydralazine, Norvasc.  Cardiology following.  5.  Elevated creatinine Creatinine noted to be approximately 1.68 on presentation.  Creatinine earlier this morning around 1.70.  Patient on gentle hydration.  If no improvement with gentle hydration will check a renal ultrasound.  Follow.      DVT prophylaxis: Lovenox Code Status:  Full Family Communication: Updated patient.  No family at bedside. Disposition:   Status is: Observation    Dispo: The patient is from: Home              Anticipated d/c is to: Home              Anticipated d/c date is: In the next 1 to 2 days.              Patient currently undergoing CVA work-up and work-up for bradycardia.  Cardiology and neurology following.  Not ready for discharge.  Patient admitted early this morning.       Consultants:   Cardiology: Dr. Algie Coffer 03/03/2020  Neurology: Dr.Aroor 03/03/2020  Procedures:   CT head 03/03/2020  2D echo 03/03/2020  MRI 03/03/2020  Chest x-ray 03/03/2020  CT angiogram head and neck 03/03/2020  EEG 03/03/2020  Antimicrobials:  None   Subjective: Patient on gurney in the ED.  Patient stating he has been sitting in urine for a couple of hours now.  Denies any chest pain.  No shortness of breath.  States he is feeling better.  Was wondering when he will be discharged.  Patient states he saw the neurologist who wants to repeat MRI of his brain more limited focus version per patient.  Objective: Vitals:   03/03/20 1115 03/03/20 1215 03/03/20 1315 03/03/20 1334  BP: (!) 121/95   (!) 150/83  Pulse:  (!) 51 65   Resp:  12 17   Temp:      TempSrc:      SpO2: 94% 98% 96%   Weight:      Height:       No intake or output data in the 24 hours ending 03/03/20 1400 Filed Weights   03/03/20 0101  Weight: 91.5 kg    Examination:  General exam: Appears calm and comfortable  Respiratory system: Clear to auscultation. Respiratory effort normal. Cardiovascular system: Bradycardia.  No JVD, no murmurs rubs or gallops.  No lower extremity edema.  Gastrointestinal system: Abdomen is nondistended, soft and nontender. No organomegaly or masses felt. Normal bowel sounds heard. Central nervous system: Alert and oriented.  Mild right facial weakness/droop.  Moving extremities spontaneously.  Extremities: Symmetric 5 x 5 power. Skin: No  rashes, lesions or ulcers Psychiatry: Judgement and insight appear fair. Mood & affect appropriate.     Data Reviewed: I have personally reviewed following labs and imaging studies  CBC: Recent Labs  Lab 03/03/20 0057 03/03/20 0102  WBC 8.9  --   NEUTROABS 7.0  --   HGB 14.3 13.3  HCT 43.2 39.0  MCV 96.0  --   PLT 127*  --     Basic Metabolic Panel: Recent Labs  Lab 03/03/20 0057 03/03/20 0102  NA 144 145  K 3.4* 3.4*  CL 112* 113*  CO2 19*  --   GLUCOSE 143* 142*  BUN 22 23  CREATININE 1.68* 1.70*  CALCIUM 9.6  --     GFR: Estimated Creatinine Clearance: 43.9 mL/min (  A) (by C-G formula based on SCr of 1.7 mg/dL (H)).  Liver Function Tests: Recent Labs  Lab 03/03/20 0057  AST 23  ALT 19  ALKPHOS 46  BILITOT 1.6*  PROT 7.2  ALBUMIN 4.4    CBG: Recent Labs  Lab 03/03/20 0053  GLUCAP 133*     Recent Results (from the past 240 hour(s))  SARS Coronavirus 2 by RT PCR (hospital order, performed in [facility] hospital lab) Nasopharyngeal Nasopharyngeal Swab     Status: None   Collection Time: 03/03/20  1:24 AM   Specimen: Nasopharyngeal Swab  Result Value Ref Range Status   SARS Coronavirus 2 NEGATIVE NEGATIVE Final    Comment: (NOTE) SARS-CoV-2 target nucleic acids are NOT DETECTED. The SARS-CoV-2 RNA is generally detectable in upper and lower respiratory specimens during the acute phase of infection. The lowest concentration of SARS-CoV-2 viral copies this assay can detect is 250 copies / mL. A negative result does not preclude SARS-CoV-2 infection and should not be used as the sole basis for treatment or other patient management decisions.  A negative result may occur with improper specimen collection / handling, submission of specimen other than nasopharyngeal swab, presence of viral mutation(s) within the areas targeted by this assay, and inadequate number of viral copies (<250 copies / mL). A negative result must be combined with  clinical observations, patient history, and epidemiological information. Fact Sheet for Patients:   BoilerBrush.com.cyhttps://www.fda.gov/media/136312/download Fact Sheet for Healthcare Providers: https://pope.com/https://www.fda.gov/media/136313/download This test is not yet approved or cleared  by the [place] FDA and has been authorized for detection and/or diagnosis of SARS-CoV-2 by FDA under an Emergency Use Authorization (EUA).  This EUA will remain in effect (meaning this test can be used) for the duration of the COVID-19 declaration under Section 564(b)(1) of the Act, 21 U.S.C. section 360bbb-3(b)(1), unless the authorization is terminated or revoked sooner. Performed at [facility], [address] [place], [place] [address]          Radiology Studies: EEG  Result Date: 03/03/2020 Charlsie QuestYadav, Priyanka O, MD     03/03/2020 10:06 AM Patient Name: David Cherry MRN: [id number] Epilepsy Attending: Charlsie QuestPriyanka O Yadav Referring Physician/Provider: Dr. Georgiana SpinnerSushanth Aroor Date: 03/03/2020 Duration: 25.57 minutes Patient history: 74 year old male who presented with aphasia concerning for seizures.  EEG evaluate for seizures. Level of alertness: Awake, drowsy, sleep, comatose, lethargic AEDs during EEG study: Keppra Technical aspects: This EEG study was done with scalp electrodes positioned according to the 10-20 International system of electrode placement. Electrical activity was acquired at a sampling rate of 500Hz  and reviewed with a high frequency filter of 70Hz  and a low frequency filter of 1Hz . EEG data were recorded continuously and digitally stored. Description: The posterior dominant rhythm consists of 9 Hz activity of moderate voltage (25-35 uV) seen predominantly in posterior head regions, symmetric and reactive to eye opening and eye closing.  Hyperventilation and photic stimulation were not performed.   IMPRESSION: This study is within normal limits. No seizures or epileptiform discharges were seen throughout the  recording. Priyanka Annabelle Harman Yadav   CT ANGIO HEAD W OR WO CONTRAST  Result Date: 03/03/2020 CLINICAL DATA:  Right-sided weakness EXAM: CT ANGIOGRAPHY HEAD AND NECK TECHNIQUE: Multidetector CT imaging of the head and neck was performed using the standard protocol during bolus administration of intravenous contrast. Multiplanar CT image reconstructions and MIPs were obtained to evaluate the vascular anatomy. Carotid stenosis measurements (when applicable) are obtained utilizing NASCET criteria, using the distal internal carotid  diameter as the denominator. CONTRAST:  76mL OMNIPAQUE IOHEXOL 350 MG/ML SOLN COMPARISON:  None. FINDINGS: CTA NECK FINDINGS SKELETON: There is no bony spinal canal stenosis. No lytic or blastic lesion. OTHER NECK: Normal pharynx, larynx and major salivary glands. No cervical lymphadenopathy. Unremarkable thyroid gland. UPPER CHEST: No pneumothorax or pleural effusion. No nodules or masses. AORTIC ARCH: There is no calcific atherosclerosis of the aortic arch. There is no aneurysm, dissection or hemodynamically significant stenosis of the visualized portion of the aorta. Conventional 3 vessel aortic branching pattern. The visualized proximal subclavian arteries are widely patent. RIGHT CAROTID SYSTEM: No dissection, occlusion or aneurysm. Mild atherosclerotic calcification at the carotid bifurcation without hemodynamically significant stenosis. LEFT CAROTID SYSTEM: No dissection, occlusion or aneurysm. Mild atherosclerotic calcification at the carotid bifurcation without hemodynamically significant stenosis. VERTEBRAL ARTERIES: Codominant configuration. Both origins are clearly patent. There is no dissection, occlusion or flow-limiting stenosis to the skull base (V1-V3 segments). CTA HEAD FINDINGS POSTERIOR CIRCULATION: --Vertebral arteries: Normal V4 segments. --Inferior cerebellar arteries: Normal. --Basilar artery: Normal. --Superior cerebellar arteries: Normal. --Posterior cerebral arteries  (PCA): Normal. ANTERIOR CIRCULATION: --Intracranial internal carotid arteries: Atherosclerotic calcification of the internal carotid arteries at the skull base without hemodynamically significant stenosis. --Anterior cerebral arteries (ACA): Normal. Absent right A1 segment, normal variant --Middle cerebral arteries (MCA): Normal. VENOUS SINUSES: As permitted by contrast timing, patent. ANATOMIC VARIANTS: None Review of the MIP images confirms the above findings. IMPRESSION: 1. No emergent large vessel occlusion or hemodynamically significant stenosis of the head or neck. 2. Mild bilateral carotid bifurcation atherosclerosis without hemodynamically significant stenosis. Electronically Signed   By: Deatra Robinson M.D.   On: 03/03/2020 01:30   CT ANGIO NECK W OR WO CONTRAST  Result Date: 03/03/2020 CLINICAL DATA:  Right-sided weakness EXAM: CT ANGIOGRAPHY HEAD AND NECK TECHNIQUE: Multidetector CT imaging of the head and neck was performed using the standard protocol during bolus administration of intravenous contrast. Multiplanar CT image reconstructions and MIPs were obtained to evaluate the vascular anatomy. Carotid stenosis measurements (when applicable) are obtained utilizing NASCET criteria, using the distal internal carotid diameter as the denominator. CONTRAST:  13mL OMNIPAQUE IOHEXOL 350 MG/ML SOLN COMPARISON:  None. FINDINGS: CTA NECK FINDINGS SKELETON: There is no bony spinal canal stenosis. No lytic or blastic lesion. OTHER NECK: Normal pharynx, larynx and major salivary glands. No cervical lymphadenopathy. Unremarkable thyroid gland. UPPER CHEST: No pneumothorax or pleural effusion. No nodules or masses. AORTIC ARCH: There is no calcific atherosclerosis of the aortic arch. There is no aneurysm, dissection or hemodynamically significant stenosis of the visualized portion of the aorta. Conventional 3 vessel aortic branching pattern. The visualized proximal subclavian arteries are widely patent. RIGHT  CAROTID SYSTEM: No dissection, occlusion or aneurysm. Mild atherosclerotic calcification at the carotid bifurcation without hemodynamically significant stenosis. LEFT CAROTID SYSTEM: No dissection, occlusion or aneurysm. Mild atherosclerotic calcification at the carotid bifurcation without hemodynamically significant stenosis. VERTEBRAL ARTERIES: Codominant configuration. Both origins are clearly patent. There is no dissection, occlusion or flow-limiting stenosis to the skull base (V1-V3 segments). CTA HEAD FINDINGS POSTERIOR CIRCULATION: --Vertebral arteries: Normal V4 segments. --Inferior cerebellar arteries: Normal. --Basilar artery: Normal. --Superior cerebellar arteries: Normal. --Posterior cerebral arteries (PCA): Normal. ANTERIOR CIRCULATION: --Intracranial internal carotid arteries: Atherosclerotic calcification of the internal carotid arteries at the skull base without hemodynamically significant stenosis. --Anterior cerebral arteries (ACA): Normal. Absent right A1 segment, normal variant --Middle cerebral arteries (MCA): Normal. VENOUS SINUSES: As permitted by contrast timing, patent. ANATOMIC VARIANTS: None Review of the MIP images confirms the above  findings. IMPRESSION: 1. No emergent large vessel occlusion or hemodynamically significant stenosis of the head or neck. 2. Mild bilateral carotid bifurcation atherosclerosis without hemodynamically significant stenosis. Electronically Signed   By: Ulyses Jarred M.D.   On: 03/03/2020 01:30   MR BRAIN WO CONTRAST  Result Date: 03/03/2020 CLINICAL DATA:  Dysphagia and right-sided weakness EXAM: MRI HEAD WITHOUT CONTRAST TECHNIQUE: Multiplanar, multiecho pulse sequences of the brain and surrounding structures were obtained without intravenous contrast. COMPARISON:  CTA head neck same day FINDINGS: BRAIN: No acute infarct, acute hemorrhage or extra-axial collection. Multifocal white matter hyperintensity, most commonly due to chronic ischemic microangiopathy.  Multiple old small vessel infarcts of the radiata. Normal volume of CSF spaces. No chronic microhemorrhage. Normal midline structures. VASCULAR: Major flow voids are preserved. SKULL AND UPPER CERVICAL SPINE: Normal calvarium and skull base. Visualized upper cervical spine and soft tissues are normal. SINUSES/ORBITS: Right mastoid fluid. Paranasal sinuses are clear. Normal orbits. IMPRESSION: 1. No acute intracranial process. 2. 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 Electronically Signed   By: Ulyses Jarred M.D.   On: 03/03/2020 02:15   DG Chest Portable 1 View  Result Date: 03/03/2020 CLINICAL DATA:  Altered mental status EXAM: PORTABLE CHEST 1 VIEW COMPARISON:  None. FINDINGS: No focal consolidation, features of edema, pneumothorax, or effusion. The aorta is calcified. The remaining cardiomediastinal contours are unremarkable. No acute osseous or soft tissue abnormality. Degenerative changes are present in the imaged spine and shoulders. Telemetry leads overlie the chest. IMPRESSION: No acute cardiopulmonary abnormality. Electronically Signed   By: Lovena Le M.D.   On: 03/03/2020 02:36   CT HEAD CODE STROKE WO CONTRAST  Result Date: 03/03/2020 CLINICAL DATA:  Code stroke.  Right-sided weakness and aphasia EXAM: CT HEAD WITHOUT CONTRAST TECHNIQUE: Contiguous axial images were obtained from the base of the skull through the vertex without intravenous contrast. COMPARISON:  None. FINDINGS: Brain: There is no mass, hemorrhage or extra-axial collection. There is generalized atrophy without lobar predilection. There is hypoattenuation of the periventricular white matter, most commonly indicating chronic ischemic microangiopathy. 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 Vascular: No abnormal hyperdensity of the major intracranial arteries or dural venous sinuses. No intracranial atherosclerosis. Skull: The visualized skull base, calvarium and extracranial soft  tissues are normal. Sinuses/Orbits: No fluid levels or advanced mucosal thickening of the visualized paranasal sinuses. No mastoid or middle ear effusion. The orbits are normal. ASPECTS [facility] Stroke Program Early CT Score) - Ganglionic level infarction (caudate, lentiform nuclei, internal capsule, insula, M1-M3 cortex): 7 - Supraganglionic infarction (M4-M6 cortex): 3 Total score (0-10 with 10 being normal): 10 IMPRESSION: 1. No acute hemorrhage. 2. Possible subacute left cerebellar infarct. 3. ASPECTS is 10. These results were communicated to Dr. Karena Addison Aroor at 1:04 am on 03/03/2020 by text page via the [facility] messaging system. Electronically Signed   By: Ulyses Jarred M.D.   On: 03/03/2020 01:04        Scheduled Meds:   stroke: mapping our early stages of recovery book   Does not apply Once   amLODipine  5 mg Oral Daily   aspirin  300 mg Rectal Daily   Or   aspirin  325 mg Oral Daily   atorvastatin  40 mg Oral Daily   atropine  0.5 mg Intravenous Once   atropine       enoxaparin (LOVENOX) injection  40 mg Subcutaneous Q24H   glucagon (human recombinant)  5 mg Intravenous Once   hydrALAZINE  25 mg Oral Q6H   Continuous Infusions:  levETIRAcetam     thiamine injection 500 mg (03/03/20 1347)     LOS: 1 day    Time spent: 35 minutes  No charge    Ramiro Harvest, MD [facility]   To contact the attending provider between 7A-7P or the covering provider during after hours 7P-7A, please log into the web site [email or web address] and access using universal [place] password for that web site. If you do not have the password, please call the hospital operator.  03/03/2020, 2:00 PM

## 2020-03-03 NOTE — ED Notes (Signed)
Dr. Nicanor Alcon arrive to the EMS bay to assess pt, pt arrive 00:50 and clear by Dr. Nicanor Alcon to CT 00:53.

## 2020-03-03 NOTE — ED Notes (Signed)
Called Dr Algie Coffer to Dr Karel Jarvis

## 2020-03-03 NOTE — Consult Note (Addendum)
Requesting Physician: Dr. Nicanor Alcon    Chief Complaint:   History obtained from: Patient and Chart     HPI:                                                                                                                                       David Cherry is a 74 y.o. male with past medical history significant for coronary artery disease status post stents, on Coumadin for unclear reason presents to [facility] emergency department as a code stroke for aphasia.  Last known normal is unclear-estimated to be 1 pm based on his last phone call.  EMS was called after passerby witnessed patient roaming on the road around 11 PM.  When EMS arrived patient had mild right-sided weakness, right facial droop and was aphasic.  Patient arrived to [facility], patient remained aphasic however was improving according to EMS.  Had mild right facial droop but no longer had right upper extremity drift.  Stat CT head was obtained which was unremarkable and CT angiogram did not show LVO.  tPA was not given as last known normal unclear-was taken for stat MRI for consideration of giving TPA the patient had DWI/Flair  Mismatch-currently DWI restriction lesion C   History obtained by speaking with brother over the phone who lives in the [place] (other close family member is a sister in [place]).  They are not aware of his medical problems apart from that he had a stent placed and he is on blood thinner.  Patient states that he stopped taking Coumadin about a month ago.  Patient lives in [place] by himself and at baseline for independent  Date last known well: 6.8.21 Time last known well: Unknown tPA Given: No, outside TPA window NIHSS: 5 Baseline MRS 0     Allergies: Not on File   Medications:                                                                                                                        I reviewed home medications   ROS:  14 systems reviewed and negative except above    Examination:                                                                                                      General: Appears well-developed . Psych: Affect appropriate to situation Eyes: No scleral injection HENT: No OP obstrucion Head: Normocephalic.  Cardiovascular: Normal rate and regular rhythm.  Respiratory: Effort normal and breath sounds normal to anterior ascultation GI: Soft.  No distension. There is no tenderness.  Skin: WDI    Neurological Examination Mental Status: Alert, oriented to himself but not age or month, thought content appropriate.  Difficulty naming objects, answering questions, paraphasic errors.  Able to simple commands without difficulty. Cranial Nerves: II: Visual fields grossly normal,  III,IV, VI: ptosis not present, extra-ocular motions intact bilaterally, pupils equal, round, reactive to light and accommodation V,VII: Mild right facial droop, facial light touch sensation normal bilaterally VIII: hearing normal bilaterally IX,X: uvula rises symmetrically XI: bilateral shoulder shrug XII: midline tongue extension Motor: Right : Upper extremity   5/5    Left:     Upper extremity   5/5  Lower extremity   5/5     Lower extremity   5/5 Tone and bulk:normal tone throughout; no atrophy noted Sensory: Pinprick and light touch intact throughout, bilaterally Deep Tendon Reflexes: 2+ and symmetric throughout Plantars: Right: downgoing   Left: downgoing Cerebellar: normal finger-to-nose, normal rapid alternating movements and normal heel-to-shin test Gait: normal gait and station     Lab Results: Basic Metabolic Panel: Recent Labs  Lab 03/03/20 0057 03/03/20 0102  NA 144 145  K 3.4* 3.4*  CL 112* 113*  CO2 19*  --   GLUCOSE 143* 142*  BUN 22 23  CREATININE 1.68* 1.70*  CALCIUM 9.6  --     CBC: Recent Labs  Lab 03/03/20 0057  03/03/20 0102  WBC 8.9  --   NEUTROABS 7.0  --   HGB 14.3 13.3  HCT 43.2 39.0  MCV 96.0  --   PLT 127*  --     Coagulation Studies: Recent Labs    03/03/20 0057  LABPROT 13.1  INR 1.0    Imaging: CT ANGIO HEAD W OR WO CONTRAST  Result Date: 03/03/2020 CLINICAL DATA:  Right-sided weakness EXAM: CT ANGIOGRAPHY HEAD AND NECK TECHNIQUE: Multidetector CT imaging of the head and neck was performed using the standard protocol during bolus administration of intravenous contrast. Multiplanar CT image reconstructions and MIPs were obtained to evaluate the vascular anatomy. Carotid stenosis measurements (when applicable) are obtained utilizing NASCET criteria, using the distal internal carotid diameter as the denominator. CONTRAST:  39mL OMNIPAQUE IOHEXOL 350 MG/ML SOLN COMPARISON:  None. FINDINGS: CTA NECK FINDINGS SKELETON: There is no bony spinal canal stenosis. No lytic or blastic lesion. OTHER NECK: Normal pharynx, larynx and major salivary glands. No cervical lymphadenopathy. Unremarkable thyroid gland. UPPER CHEST: No pneumothorax or pleural effusion. No nodules or masses. AORTIC ARCH: There is no calcific atherosclerosis of the aortic arch. There is no aneurysm, dissection or hemodynamically significant stenosis of the  visualized portion of the aorta. Conventional 3 vessel aortic branching pattern. The visualized proximal subclavian arteries are widely patent. RIGHT CAROTID SYSTEM: No dissection, occlusion or aneurysm. Mild atherosclerotic calcification at the carotid bifurcation without hemodynamically significant stenosis. LEFT CAROTID SYSTEM: No dissection, occlusion or aneurysm. Mild atherosclerotic calcification at the carotid bifurcation without hemodynamically significant stenosis. VERTEBRAL ARTERIES: Codominant configuration. Both origins are clearly patent. There is no dissection, occlusion or flow-limiting stenosis to the skull base (V1-V3 segments). CTA HEAD FINDINGS POSTERIOR  CIRCULATION: --Vertebral arteries: Normal V4 segments. --Inferior cerebellar arteries: Normal. --Basilar artery: Normal. --Superior cerebellar arteries: Normal. --Posterior cerebral arteries (PCA): Normal. ANTERIOR CIRCULATION: --Intracranial internal carotid arteries: Atherosclerotic calcification of the internal carotid arteries at the skull base without hemodynamically significant stenosis. --Anterior cerebral arteries (ACA): Normal. Absent right A1 segment, normal variant --Middle cerebral arteries (MCA): Normal. VENOUS SINUSES: As permitted by contrast timing, patent. ANATOMIC VARIANTS: None Review of the MIP images confirms the above findings. IMPRESSION: 1. No emergent large vessel occlusion or hemodynamically significant stenosis of the head or neck. 2. Mild bilateral carotid bifurcation atherosclerosis without hemodynamically significant stenosis. Electronically Signed   By: Deatra Robinson M.D.   On: 03/03/2020 01:30   CT ANGIO NECK W OR WO CONTRAST  Result Date: 03/03/2020 CLINICAL DATA:  Right-sided weakness EXAM: CT ANGIOGRAPHY HEAD AND NECK TECHNIQUE: Multidetector CT imaging of the head and neck was performed using the standard protocol during bolus administration of intravenous contrast. Multiplanar CT image reconstructions and MIPs were obtained to evaluate the vascular anatomy. Carotid stenosis measurements (when applicable) are obtained utilizing NASCET criteria, using the distal internal carotid diameter as the denominator. CONTRAST:  89mL OMNIPAQUE IOHEXOL 350 MG/ML SOLN COMPARISON:  None. FINDINGS: CTA NECK FINDINGS SKELETON: There is no bony spinal canal stenosis. No lytic or blastic lesion. OTHER NECK: Normal pharynx, larynx and major salivary glands. No cervical lymphadenopathy. Unremarkable thyroid gland. UPPER CHEST: No pneumothorax or pleural effusion. No nodules or masses. AORTIC ARCH: There is no calcific atherosclerosis of the aortic arch. There is no aneurysm, dissection or  hemodynamically significant stenosis of the visualized portion of the aorta. Conventional 3 vessel aortic branching pattern. The visualized proximal subclavian arteries are widely patent. RIGHT CAROTID SYSTEM: No dissection, occlusion or aneurysm. Mild atherosclerotic calcification at the carotid bifurcation without hemodynamically significant stenosis. LEFT CAROTID SYSTEM: No dissection, occlusion or aneurysm. Mild atherosclerotic calcification at the carotid bifurcation without hemodynamically significant stenosis. VERTEBRAL ARTERIES: Codominant configuration. Both origins are clearly patent. There is no dissection, occlusion or flow-limiting stenosis to the skull base (V1-V3 segments). CTA HEAD FINDINGS POSTERIOR CIRCULATION: --Vertebral arteries: Normal V4 segments. --Inferior cerebellar arteries: Normal. --Basilar artery: Normal. --Superior cerebellar arteries: Normal. --Posterior cerebral arteries (PCA): Normal. ANTERIOR CIRCULATION: --Intracranial internal carotid arteries: Atherosclerotic calcification of the internal carotid arteries at the skull base without hemodynamically significant stenosis. --Anterior cerebral arteries (ACA): Normal. Absent right A1 segment, normal variant --Middle cerebral arteries (MCA): Normal. VENOUS SINUSES: As permitted by contrast timing, patent. ANATOMIC VARIANTS: None Review of the MIP images confirms the above findings. IMPRESSION: 1. No emergent large vessel occlusion or hemodynamically significant stenosis of the head or neck. 2. Mild bilateral carotid bifurcation atherosclerosis without hemodynamically significant stenosis. Electronically Signed   By: Deatra Robinson M.D.   On: 03/03/2020 01:30   CT HEAD CODE STROKE WO CONTRAST  Result Date: 03/03/2020 CLINICAL DATA:  Code stroke.  Right-sided weakness and aphasia EXAM: CT HEAD WITHOUT CONTRAST TECHNIQUE: Contiguous axial images were obtained from the base of the skull  through the vertex without intravenous contrast.  COMPARISON:  None. FINDINGS: Brain: There is no mass, hemorrhage or extra-axial collection. There is generalized atrophy without lobar predilection. There is hypoattenuation of the periventricular white matter, most commonly indicating chronic ischemic microangiopathy. 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 Vascular: No abnormal hyperdensity of the major intracranial arteries or dural venous sinuses. No intracranial atherosclerosis. Skull: The visualized skull base, calvarium and extracranial soft tissues are normal. Sinuses/Orbits: No fluid levels or advanced mucosal thickening of the visualized paranasal sinuses. No mastoid or middle ear effusion. The orbits are normal. ASPECTS [facility] Stroke [facility] Early CT Score) - Ganglionic level infarction (caudate, lentiform nuclei, internal capsule, insula, M1-M3 cortex): 7 - Supraganglionic infarction (M4-M6 cortex): 3 Total score (0-10 with 10 being normal): 10 IMPRESSION: 1. No acute hemorrhage. 2. Possible subacute left cerebellar infarct. 3. ASPECTS is 10. These results were communicated to Dr. Georgiana Spinner Elesa Garman at 1:04 am on 03/03/2020 by text page via the [facility] messaging system. Electronically Signed   By: Deatra Robinson M.D.   On: 03/03/2020 01:04     ASSESSMENT AND PLAN  74 y.o. male with past medical history significant for coronary artery disease status post stents, on Coumadin for unclear reason presents to [facility] emergency department as a code stroke for aphasia. In the emergency department patient noted to have bradycardia/sinus pauses.   Transient ischemic attack versus seizure with postictal state  Recommendations #Loaded with 1 g Keppra #EEG in am, consider cEEG if abnormal #Transthoracic Echo  # Start patient on ASA 325mg  daily #Start or continue Atorvastatin 40 mg/other high intensity statin # BP goal: permissive HTN upto 185/110 mmHG #Urine drug screen # HBAIC and Lipid profile # Telemetry monitoring # Frequent neuro checks #  stroke swallow screen  Please page stroke NP  Or  PA  Or MD from 8am -4 pm  as this patient from this time will be  followed by the stroke.   You can look them up on [email or web address]  Password [facility]   Keni Wafer [facility] Pager Number [phone]

## 2020-03-03 NOTE — ED Notes (Signed)
Pt returned from echo

## 2020-03-03 NOTE — Consult Note (Signed)
Referring Physician: Dr. Berniece AndreasApril Palumbo  Rocket Abbe AmsterdamHopkins is an 74 y.o. male.                       Chief Complaint: Sinus arrhythmia and hypertension  HPI: 74 years old white male was found roaming on the road around 11 PM. He had mild right sided weakness, facial droop and aphasia. EMS brought him to the ER, he started talking and his right sided weakness improved. His CT head showed possible subacute left cerebellar infarct. CT angio head and neck was negative for significant large vessel occlusion. 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 He has stopped taking warfarin for 1 month and is not clear why was he on it. EKG shows sinus rhythm with significant sinus arrhythmia. His blood pressure is elevated. He denies chest pain. He mentioned feeling pounding in belly before he came to ER. Echocardiogram is pending. CBC, CMET and UA are unremarkable, Urine drug screen is negative.  Past Medical History:  Diagnosis Date  . Coronary artery disease   . Hypertension       Past Surgical History:  Procedure Laterality Date  . CORONARY ANGIOPLASTY WITH STENT PLACEMENT      History reviewed. No pertinent family history. Social History:  reports that he has never smoked. He has never used smokeless tobacco. He reports that he does not drink alcohol or use drugs.  Allergies: No Known Allergies  (Not in a hospital admission)   Results for orders placed or performed during the hospital encounter of 03/03/20 (from the past 48 hour(s))  Urine rapid drug screen (hosp performed)     Status: Abnormal   Collection Time: 03/03/20 12:48 AM  Result Value Ref Range   Opiates NONE DETECTED NONE DETECTED   Cocaine NONE DETECTED NONE DETECTED   Benzodiazepines POSITIVE (A) NONE DETECTED   Amphetamines NONE DETECTED NONE DETECTED   Tetrahydrocannabinol NONE DETECTED NONE DETECTED   Barbiturates NONE DETECTED NONE DETECTED     Comment: (NOTE) DRUG SCREEN FOR MEDICAL PURPOSES ONLY.  IF CONFIRMATION IS NEEDED FOR ANY PURPOSE, NOTIFY LAB WITHIN 5 DAYS. LOWEST DETECTABLE LIMITS FOR URINE DRUG SCREEN Drug Class                     Cutoff (ng/mL) Amphetamine and metabolites    1000 Barbiturate and metabolites    200 Benzodiazepine                 200 Tricyclics and metabolites     300 Opiates and metabolites        300 Cocaine and metabolites        300 THC                            50 Performed at [facility], [address] [place], [place] [id number]   Urinalysis, Routine w reflex microscopic     Status: None   Collection Time: 03/03/20 12:48 AM  Result Value Ref Range   Color, Urine YELLOW YELLOW   APPearance CLEAR CLEAR   Specific Gravity, Urine 1.026 1.005 - 1.030   pH 5.0 5.0 - 8.0   Glucose, UA NEGATIVE NEGATIVE mg/dL   Hgb urine dipstick NEGATIVE NEGATIVE   Bilirubin Urine NEGATIVE NEGATIVE   Ketones, ur NEGATIVE NEGATIVE mg/dL   Protein, ur NEGATIVE NEGATIVE mg/dL  Nitrite NEGATIVE NEGATIVE   Leukocytes,Ua NEGATIVE NEGATIVE    Comment: Performed at [facility], [address] [place], [place] [address]  CBG monitoring, ED     Status: Abnormal   Collection Time: 03/03/20 12:53 AM  Result Value Ref Range   Glucose-Capillary 133 (H) 70 - 99 mg/dL    Comment: Glucose reference range applies only to samples taken after fasting for at least 8 hours.   Comment 1 Notify RN    Comment 2 Document in Chart   Ethanol     Status: None   Collection Time: 03/03/20 12:57 AM  Result Value Ref Range   Alcohol, Ethyl (B) <10 <10 mg/dL    Comment: (NOTE) Lowest detectable limit for serum alcohol is 10 mg/dL. For medical purposes only. Performed at [facility], [address] [place], [place] [address]   Protime-INR     Status: None   Collection Time: 03/03/20 12:57 AM  Result Value Ref Range   Prothrombin Time 13.1 11.4 - 15.2 seconds   INR 1.0 0.8 - 1.2    Comment:  (NOTE) INR goal varies based on device and disease states. Performed at [facility], [address], [place], [place] [address]   APTT     Status: Abnormal   Collection Time: 03/03/20 12:57 AM  Result Value Ref Range   aPTT 23 (L) 24 - 36 seconds    Comment: Performed at [facility], [address], [place], [place] [address]  CBC     Status: Abnormal   Collection Time: 03/03/20 12:57 AM  Result Value Ref Range   WBC 8.9 4.0 - 10.5 K/uL   RBC 4.50 4.22 - 5.81 MIL/uL   Hemoglobin 14.3 13.0 - 17.0 g/dL   HCT 69.6 78.9 - 38.1 %   MCV 96.0 80.0 - 100.0 fL   MCH 31.8 26.0 - 34.0 pg   MCHC 33.1 30.0 - 36.0 g/dL   RDW 01.7 51.0 - 25.8 %   Platelets 127 (L) 150 - 400 K/uL   nRBC 0.0 0.0 - 0.2 %    Comment: Performed at [facility], [address], [place], [place] [address]  Differential     Status: None   Collection Time: 03/03/20 12:57 AM  Result Value Ref Range   Neutrophils Relative % 79 %   Neutro Abs 7.0 1.7 - 7.7 K/uL   Lymphocytes Relative 10 %   Lymphs Abs 0.9 0.7 - 4.0 K/uL   Monocytes Relative 9 %   Monocytes Absolute 0.8 0.1 - 1.0 K/uL   Eosinophils Relative 2 %   Eosinophils Absolute 0.2 0.0 - 0.5 K/uL   Basophils Relative 0 %   Basophils Absolute 0.0 0.0 - 0.1 K/uL   Immature Granulocytes 0 %   Abs Immature Granulocytes 0.03 0.00 - 0.07 K/uL    Comment: Performed at [facility], [address] [place], [place] [address]  Comprehensive metabolic panel     Status: Abnormal   Collection Time: 03/03/20 12:57 AM  Result Value Ref Range   Sodium 144 135 - 145 mmol/L   Potassium 3.4 (L) 3.5 - 5.1 mmol/L   Chloride 112 (H) 98 - 111 mmol/L   CO2 19 (L) 22 - 32 mmol/L   Glucose, Bld 143 (H) 70 - 99 mg/dL    Comment: Glucose reference range applies only to samples taken after fasting for at least 8 hours.   BUN 22 8 -  23 mg/dL   Creatinine, Ser 1.61 (H) 0.61 - 1.24 mg/dL   Calcium 9.6 8.9 - 09.6 mg/dL   Total Protein 7.2 6.5 - 8.1 g/dL    Albumin 4.4 3.5 - 5.0 g/dL   AST 23 15 - 41 U/L   ALT 19 0 - 44 U/L   Alkaline Phosphatase 46 38 - 126 U/L   Total Bilirubin 1.6 (H) 0.3 - 1.2 mg/dL   GFR calc non Af Amer 40 (L) >60 mL/min   GFR calc Af Amer 46 (L) >60 mL/min   Anion gap 13 5 - 15    Comment: Performed at [facility], [address] [place], [place] [address]  Acetaminophen level     Status: Abnormal   Collection Time: 03/03/20 12:59 AM  Result Value Ref Range   Acetaminophen (Tylenol), Serum <10 (L) 10 - 30 ug/mL    Comment: (NOTE) Therapeutic concentrations vary significantly. A range of 10-30 ug/mL  may be an effective concentration for many patients. However, some  are best treated at concentrations outside of this range. Acetaminophen concentrations >150 ug/mL at 4 hours after ingestion  and >50 ug/mL at 12 hours after ingestion are often associated with  toxic reactions. Performed at [facility], [address] [place], [place] [address]   Salicylate level     Status: Abnormal   Collection Time: 03/03/20 12:59 AM  Result Value Ref Range   Salicylate Lvl <7.0 (L) 7.0 - 30.0 mg/dL    Comment: Performed at [facility], [address], [place], [place] [address]  I-stat chem 8, ED     Status: Abnormal   Collection Time: 03/03/20  1:02 AM  Result Value Ref Range   Sodium 145 135 - 145 mmol/L   Potassium 3.4 (L) 3.5 - 5.1 mmol/L   Chloride 113 (H) 98 - 111 mmol/L   BUN 23 8 - 23 mg/dL   Creatinine, Ser 9.56 (H) 0.61 - 1.24 mg/dL   Glucose, Bld 213 (H) 70 - 99 mg/dL    Comment: Glucose reference range applies only to samples taken after fasting for at least 8 hours.   Calcium, Ion 1.17 1.15 - 1.40 mmol/L   TCO2 21 (L) 22 - 32 mmol/L   Hemoglobin 13.3 13.0 - 17.0 g/dL   HCT 08.6 57.8 - 46.9 %  SARS Coronavirus 2 by RT PCR (hospital order, performed in [facility] hospital lab) Nasopharyngeal Nasopharyngeal Swab     Status: None   Collection Time: 03/03/20  1:24 AM   Specimen:  Nasopharyngeal Swab  Result Value Ref Range   SARS Coronavirus 2 NEGATIVE NEGATIVE    Comment: (NOTE) SARS-CoV-2 target nucleic acids are NOT DETECTED. The SARS-CoV-2 RNA is generally detectable in upper and lower respiratory specimens during the acute phase of infection. The lowest concentration of SARS-CoV-2 viral copies this assay can detect is 250 copies / mL. A negative result does not preclude SARS-CoV-2 infection and should not be used as the sole basis for treatment or other patient management decisions.  A negative result may occur with improper specimen collection / handling, submission of specimen other than nasopharyngeal swab, presence of viral mutation(s) within the areas targeted by this assay, and inadequate number of viral copies (<250 copies / mL). A negative result must be combined with clinical observations, patient history, and epidemiological information. Fact Sheet for Patients:   BoilerBrush.com.cy Fact Sheet for Healthcare Providers: https://pope.com/ This test is not yet approved or cleared  by the [place] and has been authorized for detection and/or diagnosis of SARS-CoV-2 by FDA under an Emergency Use Authorization (EUA).  This EUA will remain in effect (meaning this test can be used) for the duration of the COVID-19 declaration under Section 564(b)(1) of the Act, 21 U.S.C. section 360bbb-3(b)(1), unless the authorization is terminated or revoked sooner. Performed at [facility], [address] [place], [place] [address]    CT ANGIO HEAD W OR WO CONTRAST  Result Date: 03/03/2020 CLINICAL DATA:  Right-sided weakness EXAM: CT ANGIOGRAPHY HEAD AND NECK TECHNIQUE: Multidetector CT imaging of the head and neck was performed using the standard protocol during bolus administration of intravenous contrast. Multiplanar CT image reconstructions and MIPs were obtained to evaluate the vascular anatomy.  Carotid stenosis measurements (when applicable) are obtained utilizing NASCET criteria, using the distal internal carotid diameter as the denominator. CONTRAST:  75mL OMNIPAQUE IOHEXOL 350 MG/ML SOLN COMPARISON:  None. FINDINGS: CTA NECK FINDINGS SKELETON: There is no bony spinal canal stenosis. No lytic or blastic lesion. OTHER NECK: Normal pharynx, larynx and major salivary glands. No cervical lymphadenopathy. Unremarkable thyroid gland. UPPER CHEST: No pneumothorax or pleural effusion. No nodules or masses. AORTIC ARCH: There is no calcific atherosclerosis of the aortic arch. There is no aneurysm, dissection or hemodynamically significant stenosis of the visualized portion of the aorta. Conventional 3 vessel aortic branching pattern. The visualized proximal subclavian arteries are widely patent. RIGHT CAROTID SYSTEM: No dissection, occlusion or aneurysm. Mild atherosclerotic calcification at the carotid bifurcation without hemodynamically significant stenosis. LEFT CAROTID SYSTEM: No dissection, occlusion or aneurysm. Mild atherosclerotic calcification at the carotid bifurcation without hemodynamically significant stenosis. VERTEBRAL ARTERIES: Codominant configuration. Both origins are clearly patent. There is no dissection, occlusion or flow-limiting stenosis to the skull base (V1-V3 segments). CTA HEAD FINDINGS POSTERIOR CIRCULATION: --Vertebral arteries: Normal V4 segments. --Inferior cerebellar arteries: Normal. --Basilar artery: Normal. --Superior cerebellar arteries: Normal. --Posterior cerebral arteries (PCA): Normal. ANTERIOR CIRCULATION: --Intracranial internal carotid arteries: Atherosclerotic calcification of the internal carotid arteries at the skull base without hemodynamically significant stenosis. --Anterior cerebral arteries (ACA): Normal. Absent right A1 segment, normal variant --Middle cerebral arteries (MCA): Normal. VENOUS SINUSES: As permitted by contrast timing, patent. ANATOMIC VARIANTS:  None Review of the MIP images confirms the above findings. IMPRESSION: 1. No emergent large vessel occlusion or hemodynamically significant stenosis of the head or neck. 2. Mild bilateral carotid bifurcation atherosclerosis without hemodynamically significant stenosis. Electronically Signed   By: Deatra Robinson M.D.   On: 03/03/2020 01:30   CT ANGIO NECK W OR WO CONTRAST  Result Date: 03/03/2020 CLINICAL DATA:  Right-sided weakness EXAM: CT ANGIOGRAPHY HEAD AND NECK TECHNIQUE: Multidetector CT imaging of the head and neck was performed using the standard protocol during bolus administration of intravenous contrast. Multiplanar CT image reconstructions and MIPs were obtained to evaluate the vascular anatomy. Carotid stenosis measurements (when applicable) are obtained utilizing NASCET criteria, using the distal internal carotid diameter as the denominator. CONTRAST:  75mL OMNIPAQUE IOHEXOL 350 MG/ML SOLN COMPARISON:  None. FINDINGS: CTA NECK FINDINGS SKELETON: There is no bony spinal canal stenosis. No lytic or blastic lesion. OTHER NECK: Normal pharynx, larynx and major salivary glands. No cervical lymphadenopathy. Unremarkable thyroid gland. UPPER CHEST: No pneumothorax or pleural effusion. No nodules or masses. AORTIC ARCH: There is no calcific atherosclerosis of the aortic arch. There is no aneurysm, dissection or hemodynamically significant stenosis of the visualized portion of the aorta. Conventional 3 vessel aortic branching pattern. The visualized proximal  subclavian arteries are widely patent. RIGHT CAROTID SYSTEM: No dissection, occlusion or aneurysm. Mild atherosclerotic calcification at the carotid bifurcation without hemodynamically significant stenosis. LEFT CAROTID SYSTEM: No dissection, occlusion or aneurysm. Mild atherosclerotic calcification at the carotid bifurcation without hemodynamically significant stenosis. VERTEBRAL ARTERIES: Codominant configuration. Both origins are clearly patent. There  is no dissection, occlusion or flow-limiting stenosis to the skull base (V1-V3 segments). CTA HEAD FINDINGS POSTERIOR CIRCULATION: --Vertebral arteries: Normal V4 segments. --Inferior cerebellar arteries: Normal. --Basilar artery: Normal. --Superior cerebellar arteries: Normal. --Posterior cerebral arteries (PCA): Normal. ANTERIOR CIRCULATION: --Intracranial internal carotid arteries: Atherosclerotic calcification of the internal carotid arteries at the skull base without hemodynamically significant stenosis. --Anterior cerebral arteries (ACA): Normal. Absent right A1 segment, normal variant --Middle cerebral arteries (MCA): Normal. VENOUS SINUSES: As permitted by contrast timing, patent. ANATOMIC VARIANTS: None Review of the MIP images confirms the above findings. IMPRESSION: 1. No emergent large vessel occlusion or hemodynamically significant stenosis of the head or neck. 2. Mild bilateral carotid bifurcation atherosclerosis without hemodynamically significant stenosis. Electronically Signed   By: Ulyses Jarred M.D.   On: 03/03/2020 01:30   MR BRAIN WO CONTRAST  Result Date: 03/03/2020 CLINICAL DATA:  Dysphagia and right-sided weakness EXAM: MRI HEAD WITHOUT CONTRAST TECHNIQUE: Multiplanar, multiecho pulse sequences of the brain and surrounding structures were obtained without intravenous contrast. COMPARISON:  CTA head neck same day FINDINGS: BRAIN: No acute infarct, acute hemorrhage or extra-axial collection. Multifocal white matter hyperintensity, most commonly due to chronic ischemic microangiopathy. Multiple old small vessel infarcts of the radiata. Normal volume of CSF spaces. No chronic microhemorrhage. Normal midline structures. VASCULAR: Major flow voids are preserved. SKULL AND UPPER CERVICAL SPINE: Normal calvarium and skull base. Visualized upper cervical spine and soft tissues are normal. SINUSES/ORBITS: Right mastoid fluid. Paranasal sinuses are clear. Normal orbits. IMPRESSION: 1. No acute  intracranial process. 2. 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 Electronically Signed   By: Ulyses Jarred M.D.   On: 03/03/2020 02:15   DG Chest Portable 1 View  Result Date: 03/03/2020 CLINICAL DATA:  Altered mental status EXAM: PORTABLE CHEST 1 VIEW COMPARISON:  None. FINDINGS: No focal consolidation, features of edema, pneumothorax, or effusion. The aorta is calcified. The remaining cardiomediastinal contours are unremarkable. No acute osseous or soft tissue abnormality. Degenerative changes are present in the imaged spine and shoulders. Telemetry leads overlie the chest. IMPRESSION: No acute cardiopulmonary abnormality. Electronically Signed   By: Lovena Le M.D.   On: 03/03/2020 02:36   CT HEAD CODE STROKE WO CONTRAST  Result Date: 03/03/2020 CLINICAL DATA:  Code stroke.  Right-sided weakness and aphasia EXAM: CT HEAD WITHOUT CONTRAST TECHNIQUE: Contiguous axial images were obtained from the base of the skull through the vertex without intravenous contrast. COMPARISON:  None. FINDINGS: Brain: There is no mass, hemorrhage or extra-axial collection. There is generalized atrophy without lobar predilection. There is hypoattenuation of the periventricular white matter, most commonly indicating chronic ischemic microangiopathy. 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 Vascular: No abnormal hyperdensity of the major intracranial arteries or dural venous sinuses. No intracranial atherosclerosis. Skull: The visualized skull base, calvarium and extracranial soft tissues are normal. Sinuses/Orbits: No fluid levels or advanced mucosal thickening of the visualized paranasal sinuses. No mastoid or middle ear effusion. The orbits are normal. ASPECTS [facility] Stroke Program Early CT Score) - Ganglionic level infarction (caudate, lentiform nuclei, internal capsule, insula, M1-M3 cortex): 7 - Supraganglionic infarction (M4-M6 cortex): 3 Total score (0-10 with 10 being  normal):  10 IMPRESSION: 1. No acute hemorrhage. 2. Possible subacute left cerebellar infarct. 3. ASPECTS is 10. These results were communicated to Dr. Georgiana Spinner Aroor at 1:04 am on 03/03/2020 by text page via the [facility] messaging system. Electronically Signed   By: Deatra Robinson M.D.   On: 03/03/2020 01:04    Review Of Systems Constitutional: No fever, chills, weight loss or gain. Eyes: No vision change, wears glasses. No discharge or pain. Ears: No hearing loss, No tinnitus. Respiratory: No asthma, COPD, pneumonias. No shortness of breath. No hemoptysis. Cardiovascular: No chest pain, positive palpitation, no leg edema. Gastrointestinal: No nausea, vomiting, diarrhea, constipation. No GI bleed. No hepatitis. Genitourinary: No dysuria, hematuria, kidney stone. No incontinance. Neurological: No headache, positive stroke, seizures.  Psychiatry: No psych facility admission for anxiety, depression, suicide. No detox. Skin: No rash. Musculoskeletal: Positive joint pain, no fibromyalgia. No neck pain, back pain. Lymphadenopathy: No lymphadenopathy. Hematology: No anemia or easy bruising.   Blood pressure (!) 188/145, pulse 80, temperature 97.9 F (36.6 C), temperature source Oral, resp. rate 15, height 5\' 9"  (1.753 m), weight 91.5 kg, SpO2 97 %. Body mass index is 29.79 kg/m. General appearance: alert, cooperative, appears stated age and no distress Head: Normocephalic, atraumatic. Eyes: Blue eyes, pink conjunctiva, corneas clear. PERRL, EOM's intact. Neck: No adenopathy, no carotid bruit, no JVD, supple, symmetrical, trachea midline and thyroid not enlarged. Resp: Clear to auscultation bilaterally. Cardio: Regular rate and rhythm, S1, S2 normal, II/VI systolic murmur, no click, rub or gallop GI: Soft, non-tender; bowel sounds normal; no organomegaly. Extremities: No edema, cyanosis or clubbing. Skin: Warm and dry.  Neurologic: Alert and oriented X 1, normal strength.   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 Will use IV hydralazine 10 mg. one time then change to PO for BP 188/145. PO amlodipine. Echocardiogram for LV function.  Time spent: Review of old records, Lab, x-rays, EKG, other cardiac tests, examination, discussion with patient over 70 minutes.  , MD  03/03/2020, 4:21 AM

## 2020-03-03 NOTE — Progress Notes (Signed)
  Echocardiogram 2D Echocardiogram has been performed.  David Cherry 03/03/2020, 3:40 PM

## 2020-03-03 NOTE — Consult Note (Signed)
Responded to page, pt unavailable, no family present, staff will page again if further need of chaplain services.  Rev. Cherell Colvin Chaplain 

## 2020-03-03 NOTE — Significant Event (Signed)
Rapid Response Event Note  Code Stroke called at 0022. Pt BIBM with aphasia, right sided weakness. Was found on side of road confused. Initial NIH 5, LKW unknown (last phone call made 1300). 20G PIV to LFA x2.   CT, CTA, MRI  Decision made at 0135 not to tpa and will continue to monitor.   Event Summary:  Arrived  at  0045   Event ended at  0148        [facility]

## 2020-03-03 NOTE — Progress Notes (Signed)
EEG complete - results pending 

## 2020-03-03 NOTE — ED Notes (Signed)
Pt still confuse at this time, but states he doesn't have any allergies.

## 2020-03-03 NOTE — ED Notes (Signed)
Ordered breakfast--David Cherry 

## 2020-03-03 NOTE — Procedures (Signed)
Patient Name: David Cherry  MRN: [id number]  Epilepsy Attending: Charlsie Quest  Referring Physician/Provider: Dr. Georgiana Spinner Aroor Date: 03/03/2020 Duration: 25.57 minutes  Patient history: 74 year old male who presented with aphasia concerning for seizures.  EEG evaluate for seizures.  Level of alertness: Awake, drowsy, sleep, comatose, lethargic  AEDs during EEG study: Keppra  Technical aspects: This EEG study was done with scalp electrodes positioned according to the 10-20 International system of electrode placement. Electrical activity was acquired at a sampling rate of 500Hz  and reviewed with a high frequency filter of 70Hz  and a low frequency filter of 1Hz . EEG data were recorded continuously and digitally stored.   Description: The posterior dominant rhythm consists of 9 Hz activity of moderate voltage (25-35 uV) seen predominantly in posterior head regions, symmetric and reactive to eye opening and eye closing.  Hyperventilation and photic stimulation were not performed.     IMPRESSION: This study is within normal limits. No seizures or epileptiform discharges were seen throughout the recording.  David Cherry 

## 2020-03-03 NOTE — ED Notes (Signed)
Changes noticed on the EKG of the pt, HR going from SR to SB dropping some beats, 2nd EKG done and given to Dr. Nicanor Alcon. HR drops down to low 37.

## 2020-03-03 NOTE — ED Triage Notes (Signed)
Pt brought to Ed by [facility] from the side of 85, pt was found to be confuse with dysphasia, right side weakness, some Coumadin found on his car, per pt is not taking this medication for about a month. Possible last know well 03/02/2020 at 13:00 when he made his last phone call. BP by EMS 158/74, HR 60, CBG 169, SPO2 97% RA

## 2020-03-04 DIAGNOSIS — I455 Other specified heart block: Secondary | ICD-10-CM | POA: Diagnosis present

## 2020-03-04 LAB — BASIC METABOLIC PANEL
Anion gap: 8 (ref 5–15)
BUN: 19 mg/dL (ref 8–23)
CO2: 25 mmol/L (ref 22–32)
Calcium: 8.8 mg/dL — ABNORMAL LOW (ref 8.9–10.3)
Chloride: 108 mmol/L (ref 98–111)
Creatinine, Ser: 1.3 mg/dL — ABNORMAL HIGH (ref 0.61–1.24)
GFR calc Af Amer: >60 mL/min (ref >60)
GFR calc non Af Amer: 55 mL/min — ABNORMAL LOW (ref >60)
Glucose, Bld: 98 mg/dL (ref 70–99)
Potassium: 3.4 mmol/L — ABNORMAL LOW (ref 3.5–5.1)
Sodium: 141 mmol/L (ref 135–145)

## 2020-03-04 LAB — CBC
HCT: 36.5 % — ABNORMAL LOW (ref 39.0–52.0)
Hemoglobin: 12.4 g/dL — ABNORMAL LOW (ref 13.0–17.0)
MCH: 32 pg (ref 26.0–34.0)
MCHC: 34 g/dL (ref 30.0–36.0)
MCV: 94.3 fL (ref 80.0–100.0)
Platelets: 116 10*3/uL — ABNORMAL LOW (ref 150–400)
RBC: 3.87 MIL/uL — ABNORMAL LOW (ref 4.22–5.81)
RDW: 13.2 % (ref 11.5–15.5)
WBC: 5.8 10*3/uL (ref 4.0–10.5)
nRBC: 0 % (ref 0.0–0.2)

## 2020-03-04 MED ORDER — ATORVASTATIN CALCIUM 80 MG PO TABS: 80.0000 mg | ORAL_TABLET | Freq: Every day | ORAL | Status: DC

## 2020-03-04 MED ORDER — POTASSIUM CHLORIDE CRYS ER 10 MEQ PO TBCR
10.0000 meq | EXTENDED_RELEASE_TABLET | Freq: Every day | ORAL | Status: DC
  Administered 2020-03-05 – 2020-03-08 (×4): 10 meq via ORAL
  Filled 2020-03-04 (×5): qty 1

## 2020-03-04 MED ORDER — POTASSIUM CHLORIDE ER 10 MEQ PO TBCR: 10.0000 meq | EXTENDED_RELEASE_TABLET | Freq: Every day | ORAL | 0 refills | Status: AC

## 2020-03-04 MED ORDER — ASPIRIN 81 MG PO TBEC: 81.0000 mg | DELAYED_RELEASE_TABLET | Freq: Every day | ORAL | 1 refills | Status: AC

## 2020-03-04 MED ORDER — CYCLOBENZAPRINE HCL 10 MG PO TABS: 5.0000 mg | ORAL_TABLET | Freq: Every evening | ORAL | Status: DC | PRN

## 2020-03-04 MED ORDER — DONEPEZIL HCL 5 MG PO TABS
5.0000 mg | ORAL_TABLET | Freq: Every day | ORAL | Status: DC
  Administered 2020-03-04 – 2020-03-08 (×5): 5 mg via ORAL
  Filled 2020-03-04 (×5): qty 1

## 2020-03-04 MED ORDER — CLOPIDOGREL BISULFATE 75 MG PO TABS: 75.0000 mg | ORAL_TABLET | Freq: Every day | ORAL | 1 refills | Status: AC

## 2020-03-04 MED ORDER — HYDRALAZINE HCL 50 MG PO TABS: 50.0000 mg | ORAL_TABLET | Freq: Two times a day (BID) | ORAL | 1 refills | Status: AC

## 2020-03-04 MED ORDER — POTASSIUM CHLORIDE CRYS ER 20 MEQ PO TBCR
40.0000 meq | EXTENDED_RELEASE_TABLET | Freq: Once | ORAL | Status: AC
  Administered 2020-03-04: 40 meq via ORAL
  Filled 2020-03-04: qty 2

## 2020-03-04 MED ORDER — DIAZEPAM 5 MG PO TABS
5.0000 mg | ORAL_TABLET | Freq: Two times a day (BID) | ORAL | Status: DC | PRN
  Administered 2020-03-04 – 2020-03-07 (×4): 5 mg via ORAL
  Filled 2020-03-04 (×4): qty 1

## 2020-03-04 MED ORDER — AMLODIPINE BESYLATE 5 MG PO TABS: 5.0000 mg | ORAL_TABLET | Freq: Every day | ORAL | 1 refills | Status: AC

## 2020-03-04 MED ORDER — THIAMINE HCL 100 MG PO TABS
100.0000 mg | ORAL_TABLET | Freq: Every day | ORAL | 1 refills | Status: DC
Start: 2020-03-04 — End: 2020-03-08

## 2020-03-04 MED ORDER — ATORVASTATIN CALCIUM 80 MG PO TABS: 80.0000 mg | ORAL_TABLET | Freq: Every day | ORAL | 1 refills | Status: AC

## 2020-03-04 MED ORDER — FAMOTIDINE 20 MG PO TABS
20.0000 mg | ORAL_TABLET | Freq: Two times a day (BID) | ORAL | Status: DC
  Administered 2020-03-04 – 2020-03-08 (×9): 20 mg via ORAL
  Filled 2020-03-04 (×10): qty 1

## 2020-03-04 MED ORDER — ATORVASTATIN CALCIUM 80 MG PO TABS
80.0000 mg | ORAL_TABLET | Freq: Every day | ORAL | Status: DC
  Administered 2020-03-05 – 2020-03-07 (×3): 80 mg via ORAL
  Filled 2020-03-04 (×3): qty 1

## 2020-03-04 MED ORDER — CLOPIDOGREL BISULFATE 75 MG PO TABS
75.0000 mg | ORAL_TABLET | Freq: Every day | ORAL | Status: DC
  Administered 2020-03-04 – 2020-03-08 (×5): 75 mg via ORAL
  Filled 2020-03-04 (×5): qty 1

## 2020-03-04 MED ORDER — HYDRALAZINE HCL 50 MG PO TABS
50.0000 mg | ORAL_TABLET | Freq: Two times a day (BID) | ORAL | Status: DC
  Administered 2020-03-04 – 2020-03-08 (×8): 50 mg via ORAL
  Filled 2020-03-04 (×8): qty 1

## 2020-03-04 MED ORDER — ASPIRIN EC 81 MG PO TBEC
81.0000 mg | DELAYED_RELEASE_TABLET | Freq: Every day | ORAL | Status: DC
  Administered 2020-03-05 – 2020-03-08 (×4): 81 mg via ORAL
  Filled 2020-03-04 (×5): qty 1

## 2020-03-04 NOTE — Consult Note (Addendum)
Ref: Patient, No Pcp Per   Subjective:  Episodes of 2 second sinus pauses when sleeping otherwise improved heat rate and BP control.  Negative neurology work-up. Good LV systolic function.  Patient has been off antiplatelets and anticoagulants for over 6 months after 6 months of use. Home medications are reviewed.  Objective:  Vital Signs in the last 24 hours: Temp:  [97.8 F (36.6 C)-99 F (37.2 C)] 98.8 F (37.1 C) (06/10 0732) Pulse Rate:  [51-70] 53 (06/10 0732) Cardiac Rhythm: Sinus bradycardia (06/10 0716) Resp:  [12-20] 16 (06/10 0732) BP: (118-157)/(38-95) 131/73 (06/10 0732) SpO2:  [94 %-99 %] 95 % (06/10 0732)  Physical Exam: BP Readings from Last 1 Encounters:  03/04/20 131/73     Wt Readings from Last 1 Encounters:  03/03/20 91.5 kg    Weight change:  Body mass index is 29.79 kg/m. HEENT: [place]/AT, Eyes-Blue, PERL, EOMI, Conjunctiva-Pink, Sclera-Non-icteric Neck: No JVD, No bruit, Trachea midline. Lungs:  Clear, Bilateral. Cardiac:  Regular rhythm, normal S1 and S2, no S3. II/VI systolic murmur. Abdomen:  Soft, non-tender. BS present. Extremities:  No edema present. No cyanosis. No clubbing. CNS: AxOx2, Cranial nerves grossly intact, moves all 4 extremities.  Skin: Warm and dry.   Intake/Output from previous day: 06/09 0701 - 06/10 0700 In: 3051.3 [P.O.:1200; I.V.:1652.7; IV Piggyback:198.5] Out: 200 [Urine:200]    Lab Results: BMET    Component Value Date/Time   NA 141 03/04/2020 0458   NA 145 03/03/2020 0102   NA 144 03/03/2020 0057   K 3.4 (L) 03/04/2020 0458   K 3.4 (L) 03/03/2020 0102   K 3.4 (L) 03/03/2020 0057   CL 108 03/04/2020 0458   CL 113 (H) 03/03/2020 0102   CL 112 (H) 03/03/2020 0057   CO2 25 03/04/2020 0458   CO2 19 (L) 03/03/2020 0057   GLUCOSE 98 03/04/2020 0458   GLUCOSE 142 (H) 03/03/2020 0102   GLUCOSE 143 (H) 03/03/2020 0057   BUN 19 03/04/2020 0458   BUN 23 03/03/2020 0102   BUN 22 03/03/2020 0057   CREATININE 1.30  (H) 03/04/2020 0458   CREATININE 1.70 (H) 03/03/2020 0102   CREATININE 1.68 (H) 03/03/2020 0057   CALCIUM 8.8 (L) 03/04/2020 0458   CALCIUM 9.6 03/03/2020 0057   GFRNONAA 55 (L) 03/04/2020 0458   GFRNONAA 40 (L) 03/03/2020 0057   GFRAA >60 03/04/2020 0458   GFRAA 46 (L) 03/03/2020 0057   CBC    Component Value Date/Time   WBC 5.8 03/04/2020 0458   RBC 3.87 (L) 03/04/2020 0458   HGB 12.4 (L) 03/04/2020 0458   HCT 36.5 (L) 03/04/2020 0458   PLT 116 (L) 03/04/2020 0458   MCV 94.3 03/04/2020 0458   MCH 32.0 03/04/2020 0458   MCHC 34.0 03/04/2020 0458   RDW 13.2 03/04/2020 0458   LYMPHSABS 0.9 03/03/2020 0057   MONOABS 0.8 03/03/2020 0057   EOSABS 0.2 03/03/2020 0057   BASOSABS 0.0 03/03/2020 0057   HEPATIC Function Panel Recent Labs    03/03/20 0057  PROT 7.2   HEMOGLOBIN A1C No components found for: HGA1C,  MPG CARDIAC ENZYMES No results found for: CKTOTAL, CKMB, CKMBINDEX, TROPONINI BNP No results for input(s): PROBNP in the last 8760 hours. TSH No results for input(s): TSH in the last 8760 hours. CHOLESTEROL Recent Labs    03/03/20 0519  CHOL 123    Scheduled Meds: .  stroke: mapping our early stages of recovery book   Does not apply Once  . amLODipine  5  mg Oral Daily  . aspirin  300 mg Rectal Daily   Or  . aspirin  325 mg Oral Daily  . atorvastatin  40 mg Oral Daily  . atropine  0.5 mg Intravenous Once  . enoxaparin (LOVENOX) injection  40 mg Subcutaneous Q24H  . glucagon (human recombinant)  5 mg Intravenous Once  . hydrALAZINE  25 mg Oral Q6H   Continuous Infusions: . sodium chloride 75 mL/hr at 03/04/20 0751  . levETIRAcetam Stopped (03/04/20 0721)  . 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 twice daily. Increase Atorvastatin 80 mg. Daily, Amlodipine lowered to 5 mg. Daily. Add Potassium 10 meq. Daily. Add aspirin 81 mg. one daily and resume Plavix 75 mg. one daily. Discontinue Clonidine, carvedilol, lisinopril and warfarin. F/U by Primary care and Cardiologist in [place], [place] Increase activity.   LOS: 1 day   Time spent including chart review, lab review, examination, discussion with patient : 40 min   Orpah Cobb  MD  03/04/2020, 10:26 AM

## 2020-03-04 NOTE — Progress Notes (Signed)
Occupational Therapy Treatment Patient Details Name: David Cherry MRN: [id number] DOB: 11/05/1945 Today's Date: 03/04/2020    History of present illness Pt is a 74 year old man admitted on 03/03/20 after being [place] roaming [place] the highway confused with R side weakness, facial droop and aphasia. 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 PMH: HTN, CAD    OT comments  Pt seen for administration of standardized cognitive screening tools. Pt scored 8 on the Short Blessed Test indicative of "questionable impairment, evaluate for early dementing disorder." Pt demonstrated impairment in short term memory, mental manipulation and concentration. He scored 2 points on the Mini-Cog with the cut point of <3 validated for dementia screening. Pt was unable to draw a clock, he could describe how to do it, but even when OT drew the circle to get him started, he could not accurately place numbers or hands of the clock at the designated time. Again, pt demonstrated memory deficits on this assessment as well. While pt's cognition has improved from his evaluation on 03/03/20, it is this therapist's opinion that pt needs 24 hour supervision and further neuropsychological testing. Shared outcome of cognitive screening with MD.    Follow Up Recommendations  SNF;Supervision/Assistance - 24 hour    Equipment Recommendations  None recommended by OT    Recommendations for Other Services      Precautions / Restrictions Precautions Precautions: Fall       Mobility Bed Mobility Overal bed mobility: Modified Independent             General bed mobility comments: HOB up, use of rail  Transfers                      Balance Overall balance assessment: Needs assistance   Sitting balance-Leahy Scale: Good                                     ADL either performed or assessed with clinical judgement   ADL                                                Vision       Perception     Praxis      Cognition Arousal/Alertness: Awake/alert Behavior During Therapy: WFL for tasks assessed/performed Overall Cognitive Status: Impaired/Different from baseline Area of Impairment: Orientation;Memory;Safety/judgement;Problem solving                 Orientation Level: Disoriented to;Place;Time Current Attention Level: Selective Memory: Decreased short-term memory   Safety/Judgement: Decreased awareness of deficits;Decreased awareness of safety   Problem Solving: Decreased initiation;Difficulty sequencing General Comments: Pt administered Mini-Cog and Short Blessed Test.        Exercises     Shoulder Instructions       General Comments      Pertinent Vitals/ Pain       Pain Assessment: No/denies pain  Home Living       Type of Home: Other(Comment) (condo)                              Lives With: Alone    Prior Functioning/Environment  Frequency  Min 2X/week        Progress Toward Goals  OT Goals(current goals can now be found in the care plan section)  Progress towards OT goals: Progressing toward goals  Acute Rehab OT Goals Patient Stated Goal: drive home to [place] OT Goal Formulation: With patient Time For Goal Achievement: 03/17/20 Potential to Achieve Goals: Good  Plan Discharge plan remains appropriate    Co-evaluation                 AM-PAC OT "6 Clicks" Daily Activity     Outcome Measure   Help from another person eating meals?: None Help from another person taking care of personal grooming?: A Little Help from another person toileting, which includes using toliet, bedpan, or urinal?: A Little Help from another person bathing (including washing, rinsing, drying)?: A Little Help from another person to put on and taking off regular upper body clothing?: None Help from another person to put on and taking off regular  lower body clothing?: A Little 6 Click Score: 20    End of Session    OT Visit Diagnosis: Unsteadiness on feet (R26.81);Other symptoms and signs involving cognitive function   Activity Tolerance Patient tolerated treatment well   Patient Left in bed;with call bell/phone within reach;with bed alarm set   Nurse Communication          Time: 1425-1455 OT Time Calculation (min): 30 min  Charges: OT General Charges $OT Visit: 1 Visit OT Treatments $Therapeutic Activity: 23-37 mins  Martie Round, OTR/L Acute Rehabilitation Services Pager: [phone] Office: [phone]   Evern Bio 03/04/2020, 3:05 PM

## 2020-03-04 NOTE — TOC Initial Note (Signed)
Transition of Care [facility]) - Initial/Assessment Note    Patient Details  Name: David Cherry MRN: [id number] Date of Birth: 01/15/1946  [facility] CM/SW Contact:    Kirstie Peri, [place] Work Phone Number: 03/04/2020, 10:36 AM  Clinical Narrative:                 MSW intern went to speak with pt about discharge options. Assessment stated pt was oriented x2, but patient was active and engaged in conversation and seemed to understand. Dr. Was also part of the conversation and noted that he feels like pt would be ok to discharge back to [facility] and to use his outpatient provider to get resources. The dr. Myrtie Neither we should wait for PT to reevaluate before discussing next steps.  Pt stated that he has a PCP and they are working to get him assistance through [employer] which is in [place]. He states that he has no problem getting his medications, and feels safe driving. He states he will follow up with his PCP, to get resources. He has had both pfizer vaccines, and advises that he is close with his brother, but he lives in [place]. He gives verbal permission to contact his brother. SW will touch base with pt and family after updated PT recommendations. SW will continue to follow.    Barriers to Discharge: Continued Medical Work up   Patient Goals and CMS Choice Patient states their goals for this hospitalization and ongoing recovery are:: Pt not agreeable to SNF, waiting for updated PT for reccomendations. CMS Medicare.gov Compare Post Acute Care list provided to:: Patient Choice offered to / list presented to : Patient  Expected Discharge Plan and Services   In-house Referral: Clinical Social Work     Living arrangements for the past 2 months: Single Family Home                                      Prior Living Arrangements/Services Living arrangements for the past 2 months: Single Family Home Lives with:: Self Patient language and need for interpreter  reviewed:: Yes Do you feel safe going back to the place where you live?: Yes      Need for Family Participation in Patient Care: No (Comment) Care giver support system in place?: No (comment)   Criminal Activity/Legal Involvement Pertinent to Current Situation/Hospitalization: No - Comment as needed  Activities of Daily Living Home Assistive Devices/Equipment: Walker (specify type) ADL Screening (condition at time of admission) Patient's cognitive ability adequate to safely complete daily activities?: Yes Is the patient deaf or have difficulty hearing?: No Does the patient have difficulty seeing, even when wearing glasses/contacts?: No Does the patient have difficulty concentrating, remembering, or making decisions?: Yes Patient able to express need for assistance with ADLs?: Yes Does the patient have difficulty dressing or bathing?: No Independently performs ADLs?: Yes (appropriate for developmental age) Does the patient have difficulty walking or climbing stairs?: Yes Weakness of Legs: Left Weakness of Arms/Hands: None  Permission Sought/Granted Permission sought to share information with : Family Supports Permission granted to share information with : Yes, Verbal Permission Granted  Share Information with NAME: Merry Proud     Permission granted to share info w Relationship: Brother  Permission granted to share info w Contact Information: [phone]  Emotional Assessment Appearance:: Appears stated age Attitude/Demeanor/Rapport: Engaged Affect (typically observed): Pleasant Orientation: : Oriented to Self, Oriented to  Place Alcohol / Substance Use: Not Applicable Psych Involvement: No (comment)  Admission diagnosis:  TIA (transient ischemic attack) [G45.9] Sinus pause [I45.5] Stroke-like symptoms [R29.90] Patient Active Problem List   Diagnosis Date Noted  . CAD (coronary artery disease) 03/03/2020  . Chronic anticoagulation 03/03/2020  . Stroke-like symptoms 03/03/2020   . Bradycardia 03/03/2020   PCP:  Patient, No Pcp Per Pharmacy:   [id number] - [place],  - [address] AT [facility] [address] [place] [address] Phone: [phone] Fax: [phone]     Social Determinants of Health (SDOH) Interventions    Readmission Risk Interventions No flowsheet data found.

## 2020-03-04 NOTE — Evaluation (Signed)
Speech Language Pathology Evaluation Patient Details Name: David Cherry MRN: [id number] DOB: 1946-06-14 Today's Date: 03/04/2020 Time: 4098-1191 SLP Time Calculation (min) (ACUTE ONLY): 32 min  Problem List:  Patient Active Problem List   Diagnosis Date Noted  . Sinus pause   . CAD (coronary artery disease) 03/03/2020  . Chronic anticoagulation 03/03/2020  . Stroke-like symptoms 03/03/2020  . Bradycardia 03/03/2020   Past Medical History:  Past Medical History:  Diagnosis Date  . Coronary artery disease   . Hypertension    Past Surgical History:  Past Surgical History:  Procedure Laterality Date  . CORONARY ANGIOPLASTY WITH STENT PLACEMENT     HPI:  Pt is a 74 year old man admitted on 03/03/20 after being [place] roaming [place] the highway confused with R side weakness, facial droop and aphasia. MRI negative for acute changes but revealed old small vessel infarcts and mild chronic vessel disease. PMH: HTN, CAD.   Assessment / Plan / Recommendation Clinical Impression  Pt participated in a speech/language/cognition evaluation which was limited due to pt's participation. He demonstrated tangential tendencies and frequently expressed his frustration regarding the prior "unnecessary" evaluations he had completed and the steps which he should have taken to avoid his being in the hospital. Pt expressed that therapy was attempting to find "loop holes" to keep him at the hospital. His speech and language skills appear to be within functional limits. He demonstrated intermittent word retrieval difficulty which he reported is his baseline. Pt was resistant to completion of formal cognitive-linguistic assessment stating that he had recently completed cognitive tasks with occupational therapy. Formal assessment of his cognitive-linguistic skills therefore was not completed. However, informal assessment suggest at least some difficulty with attention and higher-level cognition. Pt reported that, if  necessary, he would like to continue SLP assessment and treatment at his healthcare system in [place] since his primary goal is to go home. SLP services will therefore be discontinued at this time per pt's request.    SLP Assessment  SLP Recommendation/Assessment: All further Speech Lanaguage Pathology  needs can be addressed in the next venue of care SLP Visit Diagnosis: Cognitive communication deficit (R41.841)    Follow Up Recommendations       Frequency and Duration           SLP Evaluation Cognition  Overall Cognitive Status: Difficult to assess Arousal/Alertness: Awake/alert Orientation Level: Oriented X4 Attention: Focused;Sustained;Selective Focused Attention: Appears intact Sustained Attention: Appears intact Selective Attention: Impaired Selective Attention Impairment: Verbal complex Awareness: Impaired Awareness Impairment: Emergent impairment Problem Solving: Appears intact       Comprehension  Auditory Comprehension Overall Auditory Comprehension: Appears within functional limits for tasks assessed Yes/No Questions: Within Functional Limits Commands: Within Functional Limits Two Step Basic Commands:  (4/4) Visual Recognition/Discrimination Discrimination: Within Function Limits    Expression Expression Primary Mode of Expression: Verbal Verbal Expression Overall Verbal Expression: Appears within functional limits for tasks assessed Initiation: No impairment Level of Generative/Spontaneous Verbalization: Conversation Naming: Impairment Responsive: 76-100% accurate Confrontation: Within functional limits Convergent: 75-100% accurate Other Naming Comments: Pt needs additional processing time during conversation for word retrieval  Pragmatics: No impairment   Oral / Motor  Oral Motor/Sensory Function Overall Oral Motor/Sensory Function: Within functional limits Motor Speech Overall Motor Speech: Appears within functional limits for tasks  assessed Respiration: Within functional limits Phonation: Normal Resonance: Within functional limits Articulation: Within functional limitis Intelligibility: Intelligible Motor Planning: Witnin functional limits Motor Speech Errors: Not applicable   Matin Mattioli I. Vear Clock, MS, CCC-SLP Acute Rehabilitation  Services Office number [phone] Pager [place] 03/04/2020, 5:15 PM

## 2020-03-04 NOTE — Care Management Obs Status (Signed)
MEDICARE OBSERVATION STATUS NOTIFICATION   Patient Details  Name: David Cherry MRN: [id number] Date of Birth: [birth year]   Medicare Observation Status Notification Given:  Yes    Baldemar Lenis, LCSW 03/04/2020, 12:01 PM

## 2020-03-04 NOTE — Progress Notes (Signed)
Physical Therapy Treatment Patient Details Name: David Cherry MRN: [id number] DOB: November 22, 1945 Today's Date: 03/04/2020    History of Present Illness Pt is a 74 year old man admitted on 03/03/20 after being [place]aming [place] the highway confused with R side weakness, facial droop and aphasia. 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 PMH: HTN, CAD     PT Comments    Pt progressing towards goals. Remains very unaware of deficits. Pt requiring min A for steadying throughout mobility. Performed cognitive tasks during gait. Pt remembered room number, but walked past his room. Ran into objects on L side. Pt attempted to go into another patients room to use the bathroom. Pt walked past the bathroom in his own room and walked towards the opposite wall. When asked where he was going, pt reported to the bathroom. Pt asking for a bag that was sitting next to him in the bed. Feel current recommendations appropriate as pt lives alone and is at increased risk for falls. Will continue to follow acutely to maximize functional mobility independence and safety.      Follow Up Recommendations  SNF;Supervision/Assistance - 24 hour     Equipment Recommendations  None recommended by PT    Recommendations for Other Services       Precautions / Restrictions Precautions Precautions: Fall Restrictions Weight Bearing Restrictions: No    Mobility  Bed Mobility Overal bed mobility: Modified Independent             General bed mobility comments: HOB up, use of rail  Transfers Overall transfer level: Needs assistance Equipment used: Rolling walker (2 wheeled) Transfers: Sit to/from Stand Sit to Stand: Min assist         General transfer comment: Min A for steadying. Pt with increased shakiness upon standing.   Ambulation/Gait Ambulation/Gait assistance: Min assist;Min guard Gait Distance (Feet): 100 Feet Assistive device: Rolling walker  (2 wheeled) Gait Pattern/deviations: Step-through pattern;Decreased stride length;Drifts right/left Gait velocity: Decreased   General Gait Details: Pt unsteady and requiring min to min guard A. Ran into objects on the L side. Pt very unaware of deficits. Was unable to find room despite walking down a straight path.    Stairs             Wheelchair Mobility    Modified Rankin (Stroke Patients Only)       Balance Overall balance assessment: Needs assistance Sitting-balance support: No upper extremity supported;Feet supported Sitting balance-Leahy Scale: Good     Standing balance support: Bilateral upper extremity supported;During functional activity Standing balance-Leahy Scale: Poor Standing balance comment: reliant on B UEs and external support                             Cognition Arousal/Alertness: Awake/alert Behavior During Therapy: WFL for tasks assessed/performed Overall Cognitive Status: Impaired/Different from baseline Area of Impairment: Orientation;Memory;Safety/judgement;Problem solving                 Orientation Level: Disoriented to;Place;Time Current Attention Level: Selective Memory: Decreased short-term memory   Safety/Judgement: Decreased awareness of deficits;Decreased awareness of safety   Problem Solving: Decreased initiation;Difficulty sequencing General Comments: Pt with very poor insight to deficits. Pt remembered room number, but walked past his room. Ran into objects on L side. Pt attempted to go into another patients room to use the bathroom. Pt walked past the bathroom in his own room and walked  towards the opposite wall. When asked where he was going, pt reported to the bathroom. Pt asking for a bag that was sitting next to him.       Exercises      General Comments General comments (skin integrity, edema, etc.): Discussed safety concern with pt, however, pt with very poor insight to deficits.       Pertinent  Vitals/Pain Pain Assessment: No/denies pain    Home Living       Type of Home: Other(Comment) (condo)              Prior Function            PT Goals (current goals can now be found in the care plan section) Acute Rehab PT Goals Patient Stated Goal: drive home to [place] PT Goal Formulation: With patient Time For Goal Achievement: 03/17/20 Potential to Achieve Goals: Good Progress towards PT goals: Progressing toward goals    Frequency    Min 2X/week      PT Plan Current plan remains appropriate    Co-evaluation              AM-PAC PT "6 Clicks" Mobility   Outcome Measure  Help needed turning from your back to your side while in a flat bed without using bedrails?: None Help needed moving from lying on your back to sitting on the side of a flat bed without using bedrails?: None Help needed moving to and from a bed to a chair (including a wheelchair)?: A Little Help needed standing up from a chair using your arms (e.g., wheelchair or bedside chair)?: A Little Help needed to walk in hospital room?: A Little Help needed climbing 3-5 steps with a railing? : A Lot 6 Click Score: 19    End of Session Equipment Utilized During Treatment: Gait belt Activity Tolerance: Patient tolerated treatment well Patient left: in bed;with call bell/phone within reach;with bed alarm set Nurse Communication: Mobility status PT Visit Diagnosis: Unsteadiness on feet (R26.81);Muscle weakness (generalized) (M62.81)     Time: 5093-2671 PT Time Calculation (min) (ACUTE ONLY): 26 min  Charges:  $Gait Training: 8-22 mins $Self Care/Home Management: 8-22                     Farley Ly, PT, DPT  Acute Rehabilitation Services  Pager: [phone] Office: [phone]    Lehman Prom 03/04/2020, 4:59 PM

## 2020-03-04 NOTE — Discharge Summary (Addendum)
Physician Discharge Summary  David Cherry WUJ:[id number] DOB: [birth year] DOA: 03/03/2020  PCP: Patient, No Pcp Per  Admit date: 03/03/2020 Discharge date: 03/04/2020  Time spent: 55 minutes  Recommendations for Outpatient Follow-up:  1. Follow-up with PCP in 2 weeks.  On follow-up patient blood pressure need to be reassessed.  Patient will need referral to neurology to follow-up in 4 weeks.  Patient will need a basic metabolic profile done to follow-up on electrolytes and renal function. 2. Follow-up with primary cardiologist in [place] in 2 weeks for follow-up on bradycardia. 3. Follow-up with neurology in 4 weeks.   Discharge Diagnoses:  Principal Problem:   Stroke-like symptoms Active Problems:   CAD (coronary artery disease)   Chronic anticoagulation   Bradycardia   Sinus pause   Discharge Condition: Stable and improved  Diet recommendation: Heart healthy  Filed Weights   03/03/20 0101  Weight: 91.5 kg    History of present illness:  HPI per Dr. Ronnie Derby is a 74 y.o. male with medical history significant of CAD s/p stent, HTN.  Pt brought in to ED after [place] [address] with confusion, aphasia, R sided weakness.  Some coumadin found in his car.  Per patient hasnt been taking med for the past month.  History from patient not really great at this point due to a mixed wernickie+broca aphasia.  For example when a word is used with the patient he will start randomly putting that word into sentences: ie "I live in coumadin".  Makes history taking very challenging.  Family doesn't know much about his medical history when ED called them.   ED Course: In ED pt seen by neurology as a code stroke, not TPAed due to unk last well.  MRI brain neg for acute stroke findings.  Per Dr. Laurence Slate consider TIA vs seizure with post-ictal state.  Hospital Course:  1 rule out CVA/strokelike symptoms Patient presenting with right-sided weakness, right  facial droop and noted to be aphasic.  Patient with improvement with aphasia as well as facial droop.  Head CT done on admission negative for any acute abnormalities.  CT angiogram head and neck which was done was negative for any large vessel occlusion.  tPA not given as patient unclear as to last known normal.  MRI which was done in intracranial process, multiple old small vessel infarcts and findings of mild chronic small vessel disease noted.  Repeat diffusion-weighted MRI done was negative. 2D echo ordered with EF of 55 to 60%, no wall motion abnormalities, grade 1 diastolic dysfunction, no source of emboli noted.  2D echo however did note a grade 2 atheroma plaque involving the aortic root and ascending aorta.  Fasting lipid panel with LDL of 53.  Neurology consulted and followed the patient throughout the hospitalization..  Patient loaded with Keppra due to concerns for possible seizure versus post ictal state.  EEG also ordered and done which showed a normal study with no seizures or epileptiform discharges seen throughout the recording.  Patient maintained on aspirin, statin.  Patient's Plavix was resumed.  Antihypertensive medications adjusted per cardiology recommendations.  Patient also maintained on IV thiamine 500 mg every 8 hours during the hospitalization.  Patient improved clinically.  Per neurology given resolution of patient's symptoms with stroke work-up negative it was felt no further neurological diagnostic testing would be needed during the hospitalization and recommended outpatient follow-up with neurology.  Patient be discharged in stable and improved condition.  Patient was seen by PT/OT who  recommended SNF however patient refused SNF and as such patient will be discharged home with home health therapies.  2.  Bradycardia/sinus pauses Patient noted on presentation to the ED to have some severe bradycardia when asleep and less severe when patient was awake.  It was noted her heart rate  would drop into the 30s while patient was asleep.  Unknown as to what specific medications patient is on in terms of a nodal blocking agents.  It was noted that when patient not having pauses heart rate went almost up to 100.  Cardiology consulted and patient seen in consultation by Dr. Algie Coffer.  2D echo was ordered with normal EF of 55 to 60%, no wall motion abnormalities, grade 1 diastolic dysfunction.  Patient noted to have 2-second sinus pauses while sleeping otherwise heart rate improved when awake as well as improvement with blood pressure control.  It was noted per cardiology patient had been off antiplatelets and anticoagulants for over 6 months and after 6 months of use.  Cardiology recommended discontinuation of patient's clonidine, Coreg.  Outpatient follow-up with his primary cardiologist in [place].   3.  Hypertension Patient's blood pressure medications were adjusted per cardiology.  Patient's Norvasc was decreased to 5 mg daily.  Patient placed on hydralazine and adjusted to 50 mg twice daily.  Patient's Coreg, clonidine, lisinopril were discontinued per cardiology recommendations.  Blood pressure remained stable.  Outpatient follow-up with PCP.   4.  Chronic anticoagulation/coronary artery disease 2D echo done with a EF of 55 to 60%, no wall motion abnormalities, grade 1 diastolic dysfunction.  Patient denies any ongoing chest pain.  It was noted that patient was on anticoagulation with Coumadin for up until a month ago however noted to have stopped taking Coumadin.  INR at 1.0.    Patient seen in consultation by cardiology due to bradycardia and sinus pauses and patient's cardiac medications adjusted.  Patient was maintained on aspirin 81 mg daily, resumed on home regimen of Plavix 75 mg daily, Norvasc decreased to 5 mg daily, patient maintained on atorvastatin 80 mg daily.  Cardiology recommended patient's Coreg, clonidine, lisinopril and Coumadin be discontinued which they were on  discharge.  Outpatient follow-up with his primary cardiologist.   5.  Elevated creatinine Creatinine noted to be approximately 1.68 on presentation.  Creatinine improved with hydration such that by day of discharge creatinine was down to 1.30.  Patient's ACE inhibitor was discontinued and would not be resumed on discharge.      Procedures:  CT head 03/03/2020  2D echo 03/03/2020  MRI 03/03/2020  Chest x-ray 03/03/2020  CT angiogram head and neck 03/03/2020  EEG 03/03/2020  Repeat diffusion imaging MRI 03/03/2020   Consultations:  Cardiology: Dr. Algie Coffer 03/03/2020  Neurology: Dr.Aroor 03/03/2020  Discharge Exam: Vitals:   03/04/20 0732 03/04/20 1132  BP: 131/73 140/80  Pulse: (!) 53 92  Resp: 16 19  Temp: 98.8 F (37.1 C) 98.9 F (37.2 C)  SpO2: 95% 97%    General: NAD Cardiovascular: RRR Respiratory: CTAB  Discharge Instructions   Discharge Instructions    Diet - low sodium heart healthy   Complete by: As directed    Increase activity slowly   Complete by: As directed      Allergies as of 03/04/2020   No Known Allergies     Medication List    STOP taking these medications   lisinopril 20 MG tablet Commonly known as: ZESTRIL   warfarin 5 MG tablet Commonly known as: COUMADIN  TAKE these medications   amLODipine 5 MG tablet Commonly known as: NORVASC Take 1 tablet (5 mg total) by mouth daily. Start taking on: March 05, 2020 What changed:   medication strength  how much to take   aspirin 81 MG EC tablet Take 1 tablet (81 mg total) by mouth daily. Swallow whole. Start taking on: March 05, 2020   atorvastatin 80 MG tablet Commonly known as: LIPITOR Take 1 tablet (80 mg total) by mouth daily.   clopidogrel 75 MG tablet Commonly known as: PLAVIX Take 1 tablet (75 mg total) by mouth daily.   cyclobenzaprine 5 MG tablet Commonly known as: FLEXERIL Take 5 mg by mouth at bedtime as needed for muscle spasms.   diazepam 5 MG tablet Commonly  known as: VALIUM Take 5 mg by mouth 2 (two) times daily as needed for anxiety.   donepezil 5 MG tablet Commonly known as: ARICEPT Take 5 mg by mouth daily.   famotidine 20 MG tablet Commonly known as: PEPCID Take 20 mg by mouth 2 (two) times daily.   hydrALAZINE 50 MG tablet Commonly known as: APRESOLINE Take 1 tablet (50 mg total) by mouth 2 (two) times daily.   potassium chloride 10 MEQ tablet Commonly known as: KLOR-CON Take 1 tablet (10 mEq total) by mouth daily. Start taking on: March 05, 2020   PrePLUS 27-1 MG Tabs Take 1 mg by mouth daily.   thiamine 100 MG tablet Take 1 tablet (100 mg total) by mouth daily.      No Known Allergies  Follow-up Information    Cardiology. Schedule an appointment as soon as possible for a visit in 2 week(s).        PCP. Schedule an appointment as soon as possible for a visit in 2 week(s).        NEUROLOGY. Schedule an appointment as soon as possible for a visit in 4 week(s).                The results of significant diagnostics from this hospitalization (including imaging, microbiology, ancillary and laboratory) are listed below for reference.    Significant Diagnostic Studies: EEG  Result Date: 03/03/2020 Lora Havens, MD     03/03/2020 10:06 AM Patient Name: David Cherry MRN: [id number] Epilepsy Attending: Lora Havens Referring Physician/Provider: Dr. Karena Addison Aroor Date: 03/03/2020 Duration: 25.57 minutes Patient history: 74 year old male who presented with aphasia concerning for seizures.  EEG evaluate for seizures. Level of alertness: Awake, drowsy, sleep, comatose, lethargic AEDs during EEG study: Keppra Technical aspects: This EEG study was done with scalp electrodes positioned according to the 10-20 International system of electrode placement. Electrical activity was acquired at a sampling rate of 500Hz  and reviewed with a high frequency filter of 70Hz  and a low frequency filter of 1Hz . EEG data were recorded  continuously and digitally stored. Description: The posterior dominant rhythm consists of 9 Hz activity of moderate voltage (25-35 uV) seen predominantly in posterior head regions, symmetric and reactive to eye opening and eye closing.  Hyperventilation and photic stimulation were not performed.   IMPRESSION: This study is within normal limits. No seizures or epileptiform discharges were seen throughout the recording. Priyanka Barbra Sarks   CT ANGIO HEAD W OR WO CONTRAST  Result Date: 03/03/2020 CLINICAL DATA:  Right-sided weakness EXAM: CT ANGIOGRAPHY HEAD AND NECK TECHNIQUE: Multidetector CT imaging of the head and neck was performed using the standard protocol during bolus administration of intravenous contrast. Multiplanar CT image reconstructions and MIPs were  obtained to evaluate the vascular anatomy. Carotid stenosis measurements (when applicable) are obtained utilizing NASCET criteria, using the distal internal carotid diameter as the denominator. CONTRAST:  29mL OMNIPAQUE IOHEXOL 350 MG/ML SOLN COMPARISON:  None. FINDINGS: CTA NECK FINDINGS SKELETON: There is no bony spinal canal stenosis. No lytic or blastic lesion. OTHER NECK: Normal pharynx, larynx and major salivary glands. No cervical lymphadenopathy. Unremarkable thyroid gland. UPPER CHEST: No pneumothorax or pleural effusion. No nodules or masses. AORTIC ARCH: There is no calcific atherosclerosis of the aortic arch. There is no aneurysm, dissection or hemodynamically significant stenosis of the visualized portion of the aorta. Conventional 3 vessel aortic branching pattern. The visualized proximal subclavian arteries are widely patent. RIGHT CAROTID SYSTEM: No dissection, occlusion or aneurysm. Mild atherosclerotic calcification at the carotid bifurcation without hemodynamically significant stenosis. LEFT CAROTID SYSTEM: No dissection, occlusion or aneurysm. Mild atherosclerotic calcification at the carotid bifurcation without hemodynamically  significant stenosis. VERTEBRAL ARTERIES: Codominant configuration. Both origins are clearly patent. There is no dissection, occlusion or flow-limiting stenosis to the skull base (V1-V3 segments). CTA HEAD FINDINGS POSTERIOR CIRCULATION: --Vertebral arteries: Normal V4 segments. --Inferior cerebellar arteries: Normal. --Basilar artery: Normal. --Superior cerebellar arteries: Normal. --Posterior cerebral arteries (PCA): Normal. ANTERIOR CIRCULATION: --Intracranial internal carotid arteries: Atherosclerotic calcification of the internal carotid arteries at the skull base without hemodynamically significant stenosis. --Anterior cerebral arteries (ACA): Normal. Absent right A1 segment, normal variant --Middle cerebral arteries (MCA): Normal. VENOUS SINUSES: As permitted by contrast timing, patent. ANATOMIC VARIANTS: None Review of the MIP images confirms the above findings. IMPRESSION: 1. No emergent large vessel occlusion or hemodynamically significant stenosis of the head or neck. 2. Mild bilateral carotid bifurcation atherosclerosis without hemodynamically significant stenosis. Electronically Signed   By: Deatra Robinson M.D.   On: 03/03/2020 01:30   CT ANGIO NECK W OR WO CONTRAST  Result Date: 03/03/2020 CLINICAL DATA:  Right-sided weakness EXAM: CT ANGIOGRAPHY HEAD AND NECK TECHNIQUE: Multidetector CT imaging of the head and neck was performed using the standard protocol during bolus administration of intravenous contrast. Multiplanar CT image reconstructions and MIPs were obtained to evaluate the vascular anatomy. Carotid stenosis measurements (when applicable) are obtained utilizing NASCET criteria, using the distal internal carotid diameter as the denominator. CONTRAST:  57mL OMNIPAQUE IOHEXOL 350 MG/ML SOLN COMPARISON:  None. FINDINGS: CTA NECK FINDINGS SKELETON: There is no bony spinal canal stenosis. No lytic or blastic lesion. OTHER NECK: Normal pharynx, larynx and major salivary glands. No cervical  lymphadenopathy. Unremarkable thyroid gland. UPPER CHEST: No pneumothorax or pleural effusion. No nodules or masses. AORTIC ARCH: There is no calcific atherosclerosis of the aortic arch. There is no aneurysm, dissection or hemodynamically significant stenosis of the visualized portion of the aorta. Conventional 3 vessel aortic branching pattern. The visualized proximal subclavian arteries are widely patent. RIGHT CAROTID SYSTEM: No dissection, occlusion or aneurysm. Mild atherosclerotic calcification at the carotid bifurcation without hemodynamically significant stenosis. LEFT CAROTID SYSTEM: No dissection, occlusion or aneurysm. Mild atherosclerotic calcification at the carotid bifurcation without hemodynamically significant stenosis. VERTEBRAL ARTERIES: Codominant configuration. Both origins are clearly patent. There is no dissection, occlusion or flow-limiting stenosis to the skull base (V1-V3 segments). CTA HEAD FINDINGS POSTERIOR CIRCULATION: --Vertebral arteries: Normal V4 segments. --Inferior cerebellar arteries: Normal. --Basilar artery: Normal. --Superior cerebellar arteries: Normal. --Posterior cerebral arteries (PCA): Normal. ANTERIOR CIRCULATION: --Intracranial internal carotid arteries: Atherosclerotic calcification of the internal carotid arteries at the skull base without hemodynamically significant stenosis. --Anterior cerebral arteries (ACA): Normal. Absent right A1 segment, normal variant --Middle cerebral arteries (  MCA): Normal. VENOUS SINUSES: As permitted by contrast timing, patent. ANATOMIC VARIANTS: None Review of the MIP images confirms the above findings. IMPRESSION: 1. No emergent large vessel occlusion or hemodynamically significant stenosis of the head or neck. 2. Mild bilateral carotid bifurcation atherosclerosis without hemodynamically significant stenosis. Electronically Signed   By: Deatra Robinson M.D.   On: 03/03/2020 01:30   MR BRAIN WO CONTRAST  Result Date: 03/03/2020 CLINICAL  DATA:  Speech difficulty EXAM: MRI HEAD WITHOUT CONTRAST TECHNIQUE: Multiplanar, multiecho pulse sequences of the brain and surrounding structures were obtained without intravenous contrast. COMPARISON:  None. FINDINGS: Only diffusion-weighted imaging was performed. There is no evidence of acute ischemia. IMPRESSION: No evidence of acute ischemia. Electronically Signed   By: Deatra Robinson M.D.   On: 03/03/2020 20:34   MR BRAIN WO CONTRAST  Result Date: 03/03/2020 CLINICAL DATA:  Dysphagia and right-sided weakness EXAM: MRI HEAD WITHOUT CONTRAST TECHNIQUE: Multiplanar, multiecho pulse sequences of the brain and surrounding structures were obtained without intravenous contrast. COMPARISON:  CTA head neck same day FINDINGS: BRAIN: No acute infarct, acute hemorrhage or extra-axial collection. Multifocal white matter hyperintensity, most commonly due to chronic ischemic microangiopathy. Multiple old small vessel infarcts of the radiata. Normal volume of CSF spaces. No chronic microhemorrhage. Normal midline structures. VASCULAR: Major flow voids are preserved. SKULL AND UPPER CERVICAL SPINE: Normal calvarium and skull base. Visualized upper cervical spine and soft tissues are normal. SINUSES/ORBITS: Right mastoid fluid. Paranasal sinuses are clear. Normal orbits. IMPRESSION: 1. No acute intracranial process. 2. 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 Electronically Signed   By: Deatra Robinson M.D.   On: 03/03/2020 02:15   DG Chest Portable 1 View  Result Date: 03/03/2020 CLINICAL DATA:  Altered mental status EXAM: PORTABLE CHEST 1 VIEW COMPARISON:  None. FINDINGS: No focal consolidation, features of edema, pneumothorax, or effusion. The aorta is calcified. The remaining cardiomediastinal contours are unremarkable. No acute osseous or soft tissue abnormality. Degenerative changes are present in the imaged spine and shoulders. Telemetry leads overlie the chest. IMPRESSION:  No acute cardiopulmonary abnormality. Electronically Signed   By: Kreg Shropshire M.D.   On: 03/03/2020 02:36   ECHOCARDIOGRAM COMPLETE  Result Date: 03/03/2020    ECHOCARDIOGRAM REPORT   Patient Name:   David Cherry Date of Exam: 03/03/2020 Medical Rec #:  [id number]     Height:       69.0 in Accession #:    [id number]    Weight:       201.7 lb Date of Birth:  30-Nov-1945     BSA:          2.074 m Patient Age:    74 years      BP:           150/83 mmHg Patient Gender: M             HR:           65 bpm. Exam Location:  Inpatient Procedure: 2D Echo Indications:     TIA G45.9  History:         Patient has no prior history of Echocardiogram examinations.                  CAD; Risk Factors:Hypertension.  Sonographer:     Thurman Coyer RDCS (AE) Referring Phys:  [id number] Orpah Cobb Diagnosing Phys: Orpah Cobb MD IMPRESSIONS  1. Left ventricular ejection fraction, by estimation, is 55 to 60%. The left ventricle  has normal function. The left ventricle has no regional wall motion abnormalities. Left ventricular diastolic parameters are consistent with Grade I diastolic dysfunction (impaired relaxation). Elevated left ventricular end-diastolic pressure.  2. Right ventricular systolic function is normal. The right ventricular size is normal.  3. Left atrial size was moderately dilated.  4. Right atrial size was mildly dilated.  5. The mitral valve is normal in structure. Mild mitral valve regurgitation.  6. The aortic valve is tricuspid. Aortic valve regurgitation is not visualized. Mild aortic valve sclerosis is present, with no evidence of aortic valve stenosis.  7. There is mild (Grade II) atheroma plaque involving the aortic root and ascending aorta.  8. The inferior vena cava is normal in size with greater than 50% respiratory variability, suggesting right atrial pressure of 3 mmHg. FINDINGS  Left Ventricle: Left ventricular ejection fraction, by estimation, is 55 to 60%. The left ventricle has normal function. The  left ventricle has no regional wall motion abnormalities. The left ventricular internal cavity size was normal in size. There is  borderline left ventricular hypertrophy. Left ventricular diastolic parameters are consistent with Grade I diastolic dysfunction (impaired relaxation). Elevated left ventricular end-diastolic pressure. Right Ventricle: The right ventricular size is normal. No increase in right ventricular wall thickness. Right ventricular systolic function is normal. Left Atrium: Left atrial size was moderately dilated. Right Atrium: Right atrial size was mildly dilated. Pericardium: There is no evidence of pericardial effusion. Mitral Valve: The mitral valve is normal in structure. There is mild thickening of the mitral valve leaflet(s). There is mild calcification of the mitral valve leaflet(s). Normal mobility of the mitral valve leaflets. Mild mitral annular calcification. Mild mitral valve regurgitation. Tricuspid Valve: The tricuspid valve is normal in structure. Tricuspid valve regurgitation is mild. Aortic Valve: The aortic valve is tricuspid. . There is mild thickening and mild calcification of the aortic valve. Aortic valve regurgitation is not visualized. Mild aortic valve sclerosis is present, with no evidence of aortic valve stenosis. Mild aortic valve annular calcification. There is mild thickening of the aortic valve. There is mild calcification of the aortic valve. Pulmonic Valve: The pulmonic valve was normal in structure. Pulmonic valve regurgitation is mild. Aorta: The aortic root is normal in size and structure. There is mild (Grade II) atheroma plaque involving the aortic root and ascending aorta. Venous: The inferior vena cava is normal in size with greater than 50% respiratory variability, suggesting right atrial pressure of 3 mmHg. IAS/Shunts: No atrial level shunt detected by color flow Doppler.  LEFT VENTRICLE PLAX 2D LVIDd:         4.00 cm  Diastology LVIDs:         2.80 cm  LV  e' lateral:   10.10 cm/s LV PW:         1.10 cm  LV E/e' lateral: 5.0 LV IVS:        1.10 cm  LV e' medial:    7.72 cm/s LVOT diam:     2.40 cm  LV E/e' medial:  6.6 LV SV:         119 LV SV Index:   58 LVOT Area:     4.52 cm  RIGHT VENTRICLE RV S prime:     15.30 cm/s TAPSE (M-mode): 2.3 cm LEFT ATRIUM             Index       RIGHT ATRIUM           Index LA diam:  4.20 cm 2.03 cm/m  RA Area:     13.40 cm LA Vol (A2C):   55.9 ml 26.96 ml/m RA Volume:   26.70 ml  12.88 ml/m LA Vol (A4C):   55.0 ml 26.52 ml/m LA Biplane Vol: 56.4 ml 27.20 ml/m  AORTIC VALVE LVOT Vmax:   110.00 cm/s LVOT Vmean:  72.300 cm/s LVOT VTI:    0.264 m  AORTA Ao Root diam: 2.80 cm MITRAL VALVE MV Area (PHT): 2.73 cm    SHUNTS MV Decel Time: 278 msec    Systemic VTI:  0.26 m MV E velocity: 50.75 cm/s  Systemic Diam: 2.40 cm MV A velocity: 81.50 cm/s MV E/A ratio:  0.62 Orpah CobbAjay Kadakia MD Electronically signed by Orpah CobbAjay Kadakia MD Signature Date/Time: 03/03/2020/5:27:18 PM    Final    CT HEAD CODE STROKE WO CONTRAST  Result Date: 03/03/2020 CLINICAL DATA:  Code stroke.  Right-sided weakness and aphasia EXAM: CT HEAD WITHOUT CONTRAST TECHNIQUE: Contiguous axial images were obtained from the base of the skull through the vertex without intravenous contrast. COMPARISON:  None. FINDINGS: Brain: There is no mass, hemorrhage or extra-axial collection. There is generalized atrophy without lobar predilection. There is hypoattenuation of the periventricular white matter, most commonly indicating chronic ischemic microangiopathy. 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 Vascular: No abnormal hyperdensity of the major intracranial arteries or dural venous sinuses. No intracranial atherosclerosis. Skull: The visualized skull base, calvarium and extracranial soft tissues are normal. Sinuses/Orbits: No fluid levels or advanced mucosal thickening of the visualized paranasal sinuses. No mastoid or middle ear effusion. The orbits are normal. ASPECTS  [facility]([place] Stroke Program Early CT Score) - Ganglionic level infarction (caudate, lentiform nuclei, internal capsule, insula, M1-M3 cortex): 7 - Supraganglionic infarction (M4-M6 cortex): 3 Total score (0-10 with 10 being normal): 10 IMPRESSION: 1. No acute hemorrhage. 2. Possible subacute left cerebellar infarct. 3. ASPECTS is 10. These results were communicated to Dr. Georgiana SpinnerSushanth Aroor at 1:04 am on 03/03/2020 by text page via the [facility] messaging system. Electronically Signed   By: Deatra RobinsonKevin  Herman M.D.   On: 03/03/2020 01:04    Microbiology: Recent Results (from the past 240 hour(s))  SARS Coronavirus 2 by RT PCR [facility]hospital order[facility] hospital lab) Nasopharyngeal Nasopharyngeal Swab     Status: None   Collection Time: 03/03/20  1:24 AM   Specimen: Nasopharyngeal Swab  Result Value Ref Range Status   SARS Coronavirus 2 NEGATIVE NEGATIVE Final    Comment: (NOTE) SARS-CoV-2 target nucleic acids are NOT DETECTED. The SARS-CoV-2 RNA is generally detectable in upper and lower respiratory specimens during the acute phase of infection. The lowest concentration of SARS-CoV-2 viral copies this assay can detect is 250 copies / mL. A negative result does not preclude SARS-CoV-2 infection and should not be used as the sole basis for treatment or other patient management decisions.  A negative result may occur with improper specimen collection / handling, submission of specimen other than nasopharyngeal swab, presence of viral mutation(s) within the areas targeted by this assay, and inadequate number of viral copies (<250 copies / mL). A negative result must be combined with clinical observations, patient history, and epidemiological information. Fact Sheet for Patients:   BoilerBrush.com.cyhttps://www.fda.gov/media/136312/download Fact Sheet for Healthcare Providers: https://pope.com/https://www.fda.gov/media/136313/download This test is not yet approved or cleared  by the [place] FDA and has been authorized for  detection and/or diagnosis of SARS-CoV-2 by FDA under an Emergency Use Authorization (EUA).  This EUA will remain in effect (meaning this test can  be used) for the duration of the COVID-19 declaration under Section 564(b)(1) of the Act, 21 U.S.C. section 360bbb-3(b)(1), unless the authorization is terminated or revoked sooner. Performed at [facility], [address], [place], [place] [address]      Labs: Basic Metabolic Panel: Recent Labs  Lab 03/03/20 0057 03/03/20 0102 03/04/20 0458  NA 144 145 141  K 3.4* 3.4* 3.4*  CL 112* 113* 108  CO2 19*  --  25  GLUCOSE 143* 142* 98  BUN CREATININE 1.68* 1.70* 1.30*  CALCIUM 9.6  --  8.8*   Liver Function Tests: Recent Labs  Lab 03/03/20 0057  AST 23  ALT 19  ALKPHOS 46  BILITOT 1.6*  PROT 7.2  ALBUMIN 4.4   No results for input(s): LIPASE, AMYLASE in the last 168 hours. No results for input(s): AMMONIA in the last 168 hours. CBC: Recent Labs  Lab 03/03/20 0057 03/03/20 0102 03/04/20 0458  WBC 8.9  --  5.8  NEUTROABS 7.0  --   --   HGB 14.3 13.3 12.4*  HCT 43.2 39.0 36.5*  MCV 96.0  --  94.3  PLT 127*  --  116*   Cardiac Enzymes: No results for input(s): CKTOTAL, CKMB, CKMBINDEX, TROPONINI in the last 168 hours. BNP: BNP (last 3 results) No results for input(s): BNP in the last 8760 hours.  ProBNP (last 3 results) No results for input(s): PROBNP in the last 8760 hours.  CBG: Recent Labs  Lab 03/03/20 0053  GLUCAP 133*       Signed:  Ramiro Harvest MD.  [facility] 03/04/2020, 5:08 PM

## 2020-03-04 NOTE — Progress Notes (Addendum)
Patient was to be discharged however discharge canceled due to cognitive deficits and patient likely needing 24-hour supervision for safe disposition, as patient refusing SNF.  Social work will try to contact family to see if 24-hour supervision can be arranged before patient can be safely discharged home.    No charge

## 2020-03-04 NOTE — Care Management CC44 (Signed)
Condition Code 44 Documentation Completed  Patient Details  Name: Samik Balkcom MRN: [id number] Date of Birth: 05-Dec-1945   Condition Code 44 given:  Yes Patient signature on Condition Code 44 notice:  Yes Documentation of 2 MD's agreement:  Yes Code 44 added to claim:  Yes    Baldemar Lenis, LCSW 03/04/2020, 1:49 PM

## 2020-03-04 NOTE — Progress Notes (Signed)
NEUROLOGY PROGRESS NOTE   Subjective: At this time patient has no difficulty with speech.  Exam: Vitals:   03/04/20 0437 03/04/20 0732  BP: 118/67 131/73  Pulse: (!) 54 (!) 53  Resp: 16 16  Temp: 98.2 F (36.8 C) 98.8 F (37.1 C)  SpO2: 97% 95%    Neuro:  Mental Status: Alert, oriented, thought content appropriate.  Able to name objects with no problem, able to repeat sentences with no problem.  Speech fluent without evidence of aphasia.  Able to follow 3 step commands without difficulty. Cranial Nerves: II:  Visual fields grossly normal,  III,IV, VI: ptosis not present, extra-ocular motions intact bilaterally pupils equal, round, reactive to light and accommodation V,VII: smile still showing a left facial droop, facial light touch sensation normal bilaterally VIII: hearing normal bilaterally XI: bilateral shoulder shrug XII: midline tongue extension Motor: Right : Upper extremity   5/5    Left:     Upper extremity   5/5  Lower extremity   5/5     Lower extremity   5/5 Tone and bulk:normal tone throughout; no atrophy noted Sensory: Pinprick and light touch intact throughout, bilaterally    Medications:  Scheduled: .  stroke: mapping our early stages of recovery book   Does not apply Once  . amLODipine  5 mg Oral Daily  . aspirin  300 mg Rectal Daily   Or  . aspirin  325 mg Oral Daily  . atorvastatin  40 mg Oral Daily  . atropine  0.5 mg Intravenous Once  . enoxaparin (LOVENOX) injection  40 mg Subcutaneous Q24H  . glucagon (human recombinant)  5 mg Intravenous Once  . hydrALAZINE  25 mg Oral Q6H  . potassium chloride  40 mEq Oral Once   Continuous: . sodium chloride 75 mL/hr at 03/04/20 0751  . levETIRAcetam Stopped (03/04/20 0721)  . thiamine injection Stopped (03/03/20 2351)    Pertinent Labs/Diagnostics: LDL 53 A1c 5.6 Urine drug screen positive for benzodiazepines however he does take Valium as a home medication  Echocardiogram shows no's left  ventricular ejection fraction of 55 to 60%.  Left ventricle has normal function.  No atrial level shunt detected.   EEG  Result Date: 03/03/2020    IMPRESSION: This study is within normal limits. No seizures or epileptiform discharges were seen throughout the recording. Priyanka Barbra Sarks    CT ANGIO NECK W OR WO CONTRAST  Result Date: 03/03/2020 . IMPRESSION: 1. No emergent large vessel occlusion or hemodynamically significant stenosis of the head or neck. 2. Mild bilateral carotid bifurcation atherosclerosis without hemodynamically significant stenosis. Electronically Signed   By: Ulyses Jarred M.D.   On: 03/03/2020 01:30   MR BRAIN WO CONTRAST  Result Date: 03/03/2020 CLINICAL DATA:  Speech difficulty EXAM: MRI HEAD WITHOUT CONTRAST TECHNIQUE: Multiplanar, multiecho pulse sequences of the brain and surrounding structures were obtained without intravenous contrast. COMPARISON:  None. FINDINGS: Only diffusion-weighted imaging was performed. There is no evidence of acute ischemia. IMPRESSION: No evidence of acute ischemia. Electronically Signed   By: Ulyses Jarred M.D.   On: 03/03/2020 20:34   MR BRAIN WO CONTRAST  Result Date: 03/03/2020  IMPRESSION: 1. No acute intracranial process. 2. 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 Electronically Signed   By: Ulyses Jarred M.D.   On: 03/03/2020 02:15    CT HEAD CODE STROKE WO CONTRAST  Result Date: 03/03/2020 IMPRESSION: 1. No acute hemorrhage. 2. Possible subacute left cerebellar infarct.  3. ASPECTS is 10. These results were communicated to Dr. Georgiana Spinner Aroor at 1:04 am on 03/03/2020 by text page via the [facility] messaging system. Electronically Signed   By: Deatra Robinson M.D.   On: 03/03/2020 01:04     Felicie Morn PA-C [facility] [phone]  Assessment:  74 year old male with past medical history of CAD status post stents.  Patient presented to the hospital as code stroke secondary to  aphasia and facial droop.  As noted in the emergency department patient was bradycardic/with sinus pauses.  Currently asymptomatic.  Stroke work-up negative.  EEG shows no seizures.  Impression: -At this time most likely TIA  Recommendations: -Given EEG showed no epileptiform activity/epileptogenic city areas, this would be considered is for seizure thus would not start an antiepileptic drug -Continue aspirin, Lipitor at current dose as LDL is 53  -Given resolution of patient's symptoms with stroke work-up negative further neurological diagnostic testing would be of no further benefit.  At this time neurology will sign off.  If any further questions please call  03/04/2020, 9:27 AM

## 2020-03-05 DIAGNOSIS — I2510 Atherosclerotic heart disease of native coronary artery without angina pectoris: Secondary | ICD-10-CM

## 2020-03-05 DIAGNOSIS — Z7901 Long term (current) use of anticoagulants: Secondary | ICD-10-CM | POA: Diagnosis not present

## 2020-03-05 DIAGNOSIS — R001 Bradycardia, unspecified: Secondary | ICD-10-CM | POA: Diagnosis present

## 2020-03-05 DIAGNOSIS — I455 Other specified heart block: Secondary | ICD-10-CM | POA: Diagnosis not present

## 2020-03-05 DIAGNOSIS — R569 Unspecified convulsions: Secondary | ICD-10-CM | POA: Diagnosis present

## 2020-03-05 DIAGNOSIS — Z7902 Long term (current) use of antithrombotics/antiplatelets: Secondary | ICD-10-CM | POA: Diagnosis not present

## 2020-03-05 DIAGNOSIS — F10230 Alcohol dependence with withdrawal, uncomplicated: Secondary | ICD-10-CM | POA: Diagnosis not present

## 2020-03-05 DIAGNOSIS — R2990 Unspecified symptoms and signs involving the nervous system: Secondary | ICD-10-CM | POA: Diagnosis not present

## 2020-03-05 DIAGNOSIS — I129 Hypertensive chronic kidney disease with stage 1 through stage 4 chronic kidney disease, or unspecified chronic kidney disease: Secondary | ICD-10-CM | POA: Diagnosis present

## 2020-03-05 DIAGNOSIS — Z955 Presence of coronary angioplasty implant and graft: Secondary | ICD-10-CM | POA: Diagnosis not present

## 2020-03-05 DIAGNOSIS — G459 Transient cerebral ischemic attack, unspecified: Secondary | ICD-10-CM | POA: Diagnosis present

## 2020-03-05 DIAGNOSIS — Z8673 Personal history of transient ischemic attack (TIA), and cerebral infarction without residual deficits: Secondary | ICD-10-CM | POA: Diagnosis not present

## 2020-03-05 DIAGNOSIS — R4701 Aphasia: Secondary | ICD-10-CM | POA: Diagnosis present

## 2020-03-05 DIAGNOSIS — R29810 Facial weakness: Secondary | ICD-10-CM | POA: Diagnosis present

## 2020-03-05 DIAGNOSIS — R7989 Other specified abnormal findings of blood chemistry: Secondary | ICD-10-CM | POA: Diagnosis present

## 2020-03-05 DIAGNOSIS — Z20822 Contact with and (suspected) exposure to covid-19: Secondary | ICD-10-CM | POA: Diagnosis present

## 2020-03-05 DIAGNOSIS — Z7982 Long term (current) use of aspirin: Secondary | ICD-10-CM | POA: Diagnosis not present

## 2020-03-05 DIAGNOSIS — G8191 Hemiplegia, unspecified affecting right dominant side: Secondary | ICD-10-CM | POA: Diagnosis present

## 2020-03-05 DIAGNOSIS — Z79899 Other long term (current) drug therapy: Secondary | ICD-10-CM | POA: Diagnosis not present

## 2020-03-05 DIAGNOSIS — E876 Hypokalemia: Secondary | ICD-10-CM | POA: Diagnosis present

## 2020-03-05 DIAGNOSIS — R29705 NIHSS score 5: Secondary | ICD-10-CM | POA: Diagnosis present

## 2020-03-05 DIAGNOSIS — Z7289 Other problems related to lifestyle: Secondary | ICD-10-CM | POA: Diagnosis not present

## 2020-03-05 DIAGNOSIS — I639 Cerebral infarction, unspecified: Secondary | ICD-10-CM | POA: Diagnosis present

## 2020-03-05 DIAGNOSIS — R4189 Other symptoms and signs involving cognitive functions and awareness: Secondary | ICD-10-CM | POA: Diagnosis present

## 2020-03-05 DIAGNOSIS — N182 Chronic kidney disease, stage 2 (mild): Secondary | ICD-10-CM | POA: Diagnosis present

## 2020-03-05 LAB — BASIC METABOLIC PANEL
Anion gap: 6 (ref 5–15)
BUN: 13 mg/dL (ref 8–23)
CO2: 23 mmol/L (ref 22–32)
Calcium: 9.2 mg/dL (ref 8.9–10.3)
Chloride: 110 mmol/L (ref 98–111)
Creatinine, Ser: 1.09 mg/dL (ref 0.61–1.24)
GFR calc Af Amer: >60 mL/min (ref >60)
GFR calc non Af Amer: >60 mL/min (ref >60)
Glucose, Bld: 92 mg/dL (ref 70–99)
Potassium: 3.7 mmol/L (ref 3.5–5.1)
Sodium: 139 mmol/L (ref 135–145)

## 2020-03-05 MED ORDER — ADULT MULTIVITAMIN W/MINERALS CH
1.0000 | ORAL_TABLET | Freq: Every day | ORAL | Status: DC
  Administered 2020-03-05 – 2020-03-08 (×4): 1 via ORAL
  Filled 2020-03-05 (×4): qty 1

## 2020-03-05 MED ORDER — THIAMINE HCL 100 MG/ML IJ SOLN
500.0000 mg | Freq: Three times a day (TID) | INTRAVENOUS | Status: AC
  Administered 2020-03-05 – 2020-03-06 (×2): 500 mg via INTRAVENOUS
  Filled 2020-03-05 (×2): qty 5

## 2020-03-05 MED ORDER — LORAZEPAM 1 MG PO TABS
1.0000 mg | ORAL_TABLET | ORAL | Status: DC | PRN
  Administered 2020-03-06 – 2020-03-08 (×6): 1 mg via ORAL
  Filled 2020-03-05 (×6): qty 1

## 2020-03-05 MED ORDER — LORAZEPAM 2 MG/ML IJ SOLN: 1.0000 mg | INTRAMUSCULAR | Status: DC | PRN

## 2020-03-05 MED ORDER — FOLIC ACID 1 MG PO TABS
1.0000 mg | ORAL_TABLET | Freq: Every day | ORAL | Status: DC
  Administered 2020-03-05 – 2020-03-08 (×4): 1 mg via ORAL
  Filled 2020-03-05 (×4): qty 1

## 2020-03-05 MED ORDER — THIAMINE HCL 100 MG/ML IJ SOLN
500.0000 mg | Freq: Once | INTRAVENOUS | Status: AC
  Administered 2020-03-05: 500 mg via INTRAVENOUS
  Filled 2020-03-05: qty 5

## 2020-03-05 MED ORDER — THIAMINE HCL 100 MG/ML IJ SOLN: 100.0000 mg | Freq: Every day | INTRAMUSCULAR | Status: DC

## 2020-03-05 MED ORDER — THIAMINE HCL 100 MG PO TABS
100.0000 mg | ORAL_TABLET | Freq: Every day | ORAL | Status: DC
  Administered 2020-03-06: 100 mg via ORAL
  Filled 2020-03-05: qty 1

## 2020-03-05 NOTE — TOC Progression Note (Signed)
Transition of Care [facility]) - Progression Note    Patient Details  Name: David Cherry MRN: [id number] Date of Birth: Jun 20, 1946  Transition of Care [facility]) CM/SW [place], [place] Phone Number: 03/05/2020, 4:27 PM  Clinical Narrative:   CSW worked towards patient's discharge today. CSW spoke with patient's brother, David Cherry [phone]), to discuss the patient's current situation and assess for family support. Patient's family is out of state (brother in [place] and sister in [place]) and they are not able to come and assist the patient at this time. CSW alerted the patient's brother that the patient was having some cognitive decline and they likely needed to plan for what would happen when the patient can't care for himself any longer.   CSW found business card from the [place] about the patient's vehicle being towed to [facility] [phone]). CSW contacted them to discuss how to release the patient's vehicle, and if they would provide transport for the patient and his vehicle back to [place]. They can assist and provided a cost estimate. However, there is a hold on the patient's vehicle and the Sheriff's office will need to release it. CSW provided information to patient, and he would be agreeable to paying them to tow him back to [place]. (Patient is not cognitively safe to be driving his vehicle at this time.) CSW attempted to contact the Sheriff's office about releasing the hold, left a voicemail.   CSW had patient look through his phone for anyone who could provide support to the patient when he returns home. Patient said he spoke with his neighbor and his Restaurant manager, fast food, but they recommended that he have someone who is medically trained help him; they would not offer support. Patient texted his "girlfriend" David Cherry [phone]), and David Cherry later called and patient provided permission for CSW to speak with David Cherry. David Cherry is not the patient's girlfriend, but is a  Scientist, clinical (histocompatibility and immunogenetics) at [employer] that the patient frequents and she and her manager keep an eye on him to make sure he's doing ok. David Cherry has been to the patient's condo to assist him and would be willing to be there when he gets home to make sure he's ok, but she cannot stay 24/7 for assistance. David Cherry also provided her opinion that he is an alcoholic, he comes in quite often and drinks and he reports to her that he drinks a lot at home, as well.   CSW received update from [facility] that they cannot take on the patient due to staffing. CSW contacted [facility] and they could accept the patient for home health but would not see him until 6/19. CSW spoke with [facility] and they can see the patient and can start on Monday.   CSW spoke with the Sheriff's office and there is only a hold on the patient's vehicle until he can verify his identify. Patient will need to go to the [place] office at the jail to prove his identity and they will provide him with a ticket to release that he has to then take to the tow yard for his vehicle. CSW asked RN about patient's mobility today, and he is still unsteady on his feet. No PT assigned today to walk him, and patient was running into things while walking yesterday.  CSW discussed SNF with patient again, and he is refusing. He would be interested in receiving help in [place], as that is where his doctors are, but would prefer to receive assistance at home instead of going somewhere.  Safe discharge plan still not in place at this time. CSW to continue to follow.    Expected Discharge Plan: Home w Home Health Services Barriers to Discharge: Transportation, Unsafe home situation  Expected Discharge Plan and Services Expected Discharge Plan: Home w Home Health Services In-house Referral: Clinical Social Work     Living arrangements for the past 2 months: Single Family Home Expected Discharge Date: 03/04/20                         HH Arranged: PT, OT,  Speech Therapy, Social Work [employer]: [facility] [employer]         Social Determinants of Health (SDOH) Interventions    Readmission Risk Interventions No flowsheet data found.

## 2020-03-05 NOTE — Plan of Care (Signed)
  Problem: Education: Goal: Knowledge of disease or condition will improve Outcome: Progressing Goal: Knowledge of secondary prevention will improve Outcome: Progressing Goal: Knowledge of patient specific risk factors addressed and post discharge goals established will improve Outcome: Progressing Goal: Individualized Educational Video(s) Outcome: Progressing   Problem: Coping: Goal: Will verbalize positive feelings about self Outcome: Progressing Goal: Will identify appropriate support needs Outcome: Progressing   Problem: Health Behavior/Discharge Planning: Goal: Ability to manage health-related needs will improve Outcome: Progressing   Problem: Self-Care: Goal: Ability to participate in self-care as condition permits will improve Outcome: Progressing   Problem: Education: Goal: Knowledge of General Education information will improve Description: Including pain rating scale, medication(s)/side effects and non-pharmacologic comfort measures Outcome: Progressing   Problem: Health Behavior/Discharge Planning: Goal: Ability to manage health-related needs will improve Outcome: Progressing   Problem: Clinical Measurements: Goal: Ability to maintain clinical measurements within normal limits will improve Outcome: Progressing Goal: Will remain free from infection Outcome: Progressing Goal: Diagnostic test results will improve Outcome: Progressing Goal: Respiratory complications will improve Outcome: Progressing Goal: Cardiovascular complication will be avoided Outcome: Progressing   Problem: Activity: Goal: Risk for activity intolerance will decrease Outcome: Progressing   Problem: Nutrition: Goal: Adequate nutrition will be maintained Outcome: Progressing   Problem: Coping: Goal: Level of anxiety will decrease Outcome: Progressing   Problem: Elimination: Goal: Will not experience complications related to bowel motility Outcome: Progressing Goal: Will not  experience complications related to urinary retention Outcome: Progressing   Problem: Pain Managment: Goal: General experience of comfort will improve Outcome: Progressing   Problem: Safety: Goal: Ability to remain free from injury will improve Outcome: Progressing   Problem: Skin Integrity: Goal: Risk for impaired skin integrity will decrease Outcome: Progressing

## 2020-03-05 NOTE — TOC Progression Note (Signed)
Transition of Care [facility]) - Progression Note    Patient Details  Name: David Cherry MRN: [id number] Date of Birth: 03-12-1946  Transition of Care [facility]) CM/SW [place], [place] Phone Number: 03/05/2020, 4:21 PM  Clinical Narrative:   CSW attempted to work towards discharge home for patient, as he is refusing SNF. CSW did schedule a PCP follow up for patient with [facility] for 6/15 at 2:15 PM. Patient has been scheduled for outpatient neurology appointment with [facility] for 6/17 at 12:15 PM. Patient's PCP office has been attempting to set him up with chronic care management, but he has been difficult to get in touch with for them to schedule and has not seemed to understand what it is for (this is the [facility] that he has been talking about). Per patient's PCP, they partner with either [facility] or [facility] for home health. CSW sent referral to [facility], they are reviewing.   CSW met with patient to discuss his discharge home, and patient was unable to follow simple directions to look for his keys or his wallet in his belongings bag right next to him. Per PT and OT, they are still recommending SNF or 24/7 assist due to his deficits and he does not have them.  Patient is not a safe discharge home at this time due to cognitive deficits and he is not independent in his mobility at this time. CSW updated MD that patient is not safe for discharge home, discharge canceled. CSW to follow.    Expected Discharge Plan: [place] Barriers to Discharge: Transportation, Unsafe home situation  Expected Discharge Plan and Services Expected Discharge Plan: [place] In-house Referral: Clinical Social Work     Living arrangements for the past 2 months: Single Family Home Expected Discharge Date: 03/04/20                         HH Arranged: PT, OT, Speech Therapy, Social Work [employer] Agency: [facility] [facility]  [employer]         Social Determinants of Health ([place]) Interventions    Readmission Risk Interventions No flowsheet data found.

## 2020-03-05 NOTE — Progress Notes (Addendum)
PROGRESS NOTE    David Cherry  RJJ:[id number] DOB: 25-Apr-1946 DOA: 03/03/2020 PCP: Patient, No Pcp Per    Chief Complaint  Patient presents with  . Code Stroke    Brief Narrative:  HPI per Dr. Ronnie Derby is a 74 y.o. male with medical history significant of CAD s/p stent, HTN.  Pt brought in [place] ED [place] with confusion, aphasia, R sided weakness.  Some coumadin found in his car.  Per patient hasnt been taking med for the past month.  History from patient not really great at this point due to a mixed wernickie+broca aphasia.  For example when a word is used with the patient he will start randomly putting that word into sentences: ie "I live in coumadin".  Makes history taking very challenging.  Family doesn't know much about his medical history when ED called them.   ED Course: In ED pt seen by neurology as a code stroke, not TPAed due to unk last well.  MRI brain neg for acute stroke findings.  Per Dr. Laurence Slate consider TIA vs seizure with post-ictal state.  Assessment & Plan:   Principal Problem:   Stroke-like symptoms Active Problems:   CAD (coronary artery disease)   Chronic anticoagulation   Bradycardia   Sinus pause  1 rule out CVA/strokelike symptoms Patient presenting with right-sided weakness, right facial droop and noted to be aphasic.  Patient with improvement with aphasia as well as facial droop.  Head CT done on admission negative for any acute abnormalities.  CT angiogram head and neck which was done was negative for any large vessel occlusion.  tPA not given as patient unclear as to last known normal.  MRI which was done in intracranial process, multiple old small vessel infarcts and findings of mild chronic small vessel disease noted.  2D echo ordered with EF of 55 to 60%, no wall motion abnormalities, grade 1 diastolic dysfunction, no source of emboli noted.  2D echo however did note a grade 2 atheroma plaque  involving the aortic root and ascending aorta.  Fasting lipid panel with LDL of 53.  Neurology consulted and following.  Patient loaded with Keppra due to concerns for possible seizure versus post ictal state.  EEG also ordered and done which showed a normal study with no seizures or epileptiform discharges seen throughout the recording.  Continue aspirin, Plavix, statin.  Continue current regimen of Norvasc and hydralazine for blood pressure control.  PT/OT/ST.  Continue IV thiamine 500 mg every 8 hours x1 more day.  Repeat diffusion-weighted MRI done negative.  Per neurology given patient's resolution of symptoms and negative stroke work-up is felt no further neurological diagnostic testing needed at this time and outpatient follow-up with neurology.  Patient with some cognitive deficits and concern for safe disposition as patient currently refusing SNF.  Social worker working diligently to see patient will be able to have 24-hour supervision and safe disposition back to his home in [place].  Outpatient follow-up with neurology.  2.  Bradycardia/sinus pauses Patient noted on presentation to the ED to have some severe bradycardia when asleep and less severe when patient was awake.  It was noted her heart rate would drop into the 30s while patient was asleep.  Unknown as to what specific medications patient is on in terms of a nodal blocking agents.  It was noted that when patient not having pauses heart rate went almost up to 100.  Cardiology consulted and patient seen  in consultation by Dr. Algie Coffer.  2D echo was ordered with normal EF of 55 to 60%, no wall motion abnormalities, grade 1 diastolic dysfunction.  Per cardiology it is noted that patient has been off antiplatelets and anticoagulants for over 6 months and after 6 months use.  Cardiology recommending discontinuation of patient's clonidine, Coreg.  Outpatient follow-up with patient's cardiologist in [place].    3.  Hypertension Continue Norvasc  and hydralazine.  4.  Chronic anticoagulation/coronary artery disease 2D echo done with a EF of 55 to 60%, no wall motion abnormalities, grade 1 diastolic dysfunction.  Patient denies any ongoing chest pain.  It was noted that patient was on anticoagulation with Coumadin for up until a month ago however noted to have stopped taking Coumadin.  INR at 1.0.  Continue aspirin, Lipitor, hydralazine, Norvasc, Plavix as recommended per cardiology.  Cardiology recommended discontinuation of Coumadin.  Cardiology following.  5.  Elevated creatinine Creatinine noted to be approximately 1.68 on presentation.  Creatinine improved and currently at 1.09.  Saline lock IV fluids.  Follow.   6. ??  EtOH use Patient with no withdrawal at this time.  Will place on the Ativan withdrawal protocol, folic acid, multivitamin, thiamine.  7.  Intermittent confusion Patient noted to have some intermittent confusion from time to time.  Concern patient may have a history of alcohol use however patient denied any alcohol on admission.  Patient not going through any withdrawal.??  element of Wernicke Korsakoff.  We will check a RBC folate, ammonia level, vitamin B12 level, TSH.  Patient had received 2 days of thiamine 500 mg IV 3 times daily.  We will give thiamine 500 mg IV 3 times daily x1 more day and then subsequently place on thiamine 100 mg daily.     DVT prophylaxis: Lovenox Code Status: Full Family Communication: Updated patient.  No family at bedside. Disposition:   Status is: Observation    Dispo: The patient is from: Home              Anticipated d/c is to: Home              Anticipated d/c date is: Once safe disposition obtained with 24-hour supervision on discharge.              Patient currently with some cognitive deficits/memory issues and likely needs 24-hour supervision for safe disposition as patient refusing SNF at this time.  Social work trying to Systems analyst 24-hour supervision and  safe discharge/disposition for patient to get back home to [place].  Currently unsafe disposition at this time.        Consultants:   Cardiology: Dr. Algie Coffer 03/03/2020  Neurology: Dr.Aroor 03/03/2020  Procedures:   CT head 03/03/2020  2D echo 03/03/2020  MRI 03/03/2020  Chest x-ray 03/03/2020  CT angiogram head and neck 03/03/2020  EEG 03/03/2020  Antimicrobials:  None   Subjective: Patient in bed.  Alert and oriented to self and place.  Thinks he is in [place].  Denies any chest pain.  No shortness of breath.  Asking whether he is going to be able to go home today.  Objective: Vitals:   03/04/20 2013 03/04/20 2303 03/05/20 0340 03/05/20 0738  BP: (!) 180/97 (!) 159/98 131/74 (!) 151/80  Pulse: 73 63 (!) 52 (!) 46  Resp: 16 17 19 17   Temp: 98.3 F (36.8 C) 98.2 F (36.8 C) 97.9 F (36.6 C) 98.4 F (36.9 C)  TempSrc: Oral Oral Oral Oral  SpO2: 96%  97% 96% 97%  Weight:      Height:        Intake/Output Summary (Last 24 hours) at 03/05/2020 1026 Last data filed at 03/05/2020 0400 Gross per 24 hour  Intake 682.5 ml  Output 500 ml  Net 182.5 ml   Filed Weights   03/03/20 0101  Weight: 91.5 kg    Examination:  General exam: NAD Respiratory system: CTA B.  No wheezes, no crackles, no rhonchi.  Normal respiratory effort.  Speaking in full sentences.   Cardiovascular system: Regular rate rhythm no murmurs rubs or gallops.  No JVD.  No lower extremity edema.  Gastrointestinal system: Abdomen is soft, nontender, nondistended, positive bowel sounds.  No rebound.  No guarding. Central nervous system: Alert and oriented.  Mild right facial weakness/droop.  Moving extremities spontaneously.  Extremities: Symmetric 5 x 5 power. Skin: No rashes, lesions or ulcers Psychiatry: Judgement and insight appear fair. Mood & affect appropriate.     Data Reviewed: I have personally reviewed following labs and imaging studies  CBC: Recent Labs  Lab 03/03/20 0057 03/03/20 0102  03/04/20 0458  WBC 8.9  --  5.8  NEUTROABS 7.0  --   --   HGB 14.3 13.3 12.4*  HCT 43.2 39.0 36.5*  MCV 96.0  --  94.3  PLT 127*  --  116*    Basic Metabolic Panel: Recent Labs  Lab 03/03/20 0057 03/03/20 0102 03/04/20 0458 03/05/20 0720  NA 144 145 141 139  K 3.4* 3.4* 3.4* 3.7  CL 112* 113* 108 110  CO2 19*  --  25 23  GLUCOSE 143* 142* 98 92  BUN 22 23 19 13   CREATININE 1.68* 1.70* 1.30* 1.09  CALCIUM 9.6  --  8.8* 9.2    GFR: Estimated Creatinine Clearance: 68.5 mL/min (by C-G formula based on SCr of 1.09 mg/dL).  Liver Function Tests: Recent Labs  Lab 03/03/20 0057  AST 23  ALT 19  ALKPHOS 46  BILITOT 1.6*  PROT 7.2  ALBUMIN 4.4    CBG: Recent Labs  Lab 03/03/20 0053  GLUCAP 133*     Recent Results (from the past 240 hour(s))  SARS Coronavirus 2 by RT PCR (hospital order, performed in [facility] hospital lab) Nasopharyngeal Nasopharyngeal Swab     Status: None   Collection Time: 03/03/20  1:24 AM   Specimen: Nasopharyngeal Swab  Result Value Ref Range Status   SARS Coronavirus 2 NEGATIVE NEGATIVE Final    Comment: (NOTE) SARS-CoV-2 target nucleic acids are NOT DETECTED. The SARS-CoV-2 RNA is generally detectable in upper and lower respiratory specimens during the acute phase of infection. The lowest concentration of SARS-CoV-2 viral copies this assay can detect is 250 copies / mL. A negative result does not preclude SARS-CoV-2 infection and should not be used as the sole basis for treatment or other patient management decisions.  A negative result may occur with improper specimen collection / handling, submission of specimen other than nasopharyngeal swab, presence of viral mutation(s) within the areas targeted by this assay, and inadequate number of viral copies (<250 copies / mL). A negative result must be combined with clinical observations, patient history, and epidemiological information. Fact Sheet for Patients:     05/03/20 Fact Sheet for Healthcare Providers: BoilerBrush.com.cy This test is not yet approved or cleared  by the https://pope.com/ FDA and has been authorized for detection and/or diagnosis of SARS-CoV-2 by FDA under an Emergency Use Authorization (EUA).  This EUA will remain in effect (  meaning this test can be used) for the duration of the COVID-19 declaration under Section 564(b)(1) of the Act, 21 U.S.C. section 360bbb-3(b)(1), unless the authorization is terminated or revoked sooner. Performed at [facility], [address] [place], [place] [address]          Radiology Studies: MR BRAIN WO CONTRAST  Result Date: 03/03/2020 CLINICAL DATA:  Speech difficulty EXAM: MRI HEAD WITHOUT CONTRAST TECHNIQUE: Multiplanar, multiecho pulse sequences of the brain and surrounding structures were obtained without intravenous contrast. COMPARISON:  None. FINDINGS: Only diffusion-weighted imaging was performed. There is no evidence of acute ischemia. IMPRESSION: No evidence of acute ischemia. Electronically Signed   By: Deatra RobinsonKevin  Herman M.D.   On: 03/03/2020 20:34   ECHOCARDIOGRAM COMPLETE  Result Date: 03/03/2020    ECHOCARDIOGRAM REPORT   Patient Name:   Olin HauserBRUCE Hertzog Date of Exam: 03/03/2020 Medical Rec #:  [id number]     Height:       69.0 in Accession #:    [id number]    Weight:       201.7 lb Date of Birth:  [birth year]     BSA:          2.074 m Patient Age:    74 years      BP:           150/83 mmHg Patient Gender: M             HR:           65 bpm. Exam Location:  Inpatient Procedure: 2D Echo Indications:     TIA G45.9  History:         Patient has no prior history of Echocardiogram examinations.                  CAD; Risk Factors:Hypertension.  Sonographer:     Thurman Coyerasey Kirkpatrick RDCS (AE) Referring Phys:  [id number] Orpah CobbAJAY KADAKIA Diagnosing Phys: Orpah CobbAjay Kadakia MD IMPRESSIONS  1. Left ventricular ejection fraction, by estimation, is 55 to 60%. The  left ventricle has normal function. The left ventricle has no regional wall motion abnormalities. Left ventricular diastolic parameters are consistent with Grade I diastolic dysfunction (impaired relaxation). Elevated left ventricular end-diastolic pressure.  2. Right ventricular systolic function is normal. The right ventricular size is normal.  3. Left atrial size was moderately dilated.  4. Right atrial size was mildly dilated.  5. The mitral valve is normal in structure. Mild mitral valve regurgitation.  6. The aortic valve is tricuspid. Aortic valve regurgitation is not visualized. Mild aortic valve sclerosis is present, with no evidence of aortic valve stenosis.  7. There is mild (Grade II) atheroma plaque involving the aortic root and ascending aorta.  8. The inferior vena cava is normal in size with greater than 50% respiratory variability, suggesting right atrial pressure of 3 mmHg. FINDINGS  Left Ventricle: Left ventricular ejection fraction, by estimation, is 55 to 60%. The left ventricle has normal function. The left ventricle has no regional wall motion abnormalities. The left ventricular internal cavity size was normal in size. There is  borderline left ventricular hypertrophy. Left ventricular diastolic parameters are consistent with Grade I diastolic dysfunction (impaired relaxation). Elevated left ventricular end-diastolic pressure. Right Ventricle: The right ventricular size is normal. No increase in right ventricular wall thickness. Right ventricular systolic function is normal. Left Atrium: Left atrial size was moderately dilated. Right Atrium: Right atrial size was mildly dilated. Pericardium: There is no evidence of pericardial effusion. Mitral Valve: The  mitral valve is normal in structure. There is mild thickening of the mitral valve leaflet(s). There is mild calcification of the mitral valve leaflet(s). Normal mobility of the mitral valve leaflets. Mild mitral annular calcification. Mild  mitral valve regurgitation. Tricuspid Valve: The tricuspid valve is normal in structure. Tricuspid valve regurgitation is mild. Aortic Valve: The aortic valve is tricuspid. . There is mild thickening and mild calcification of the aortic valve. Aortic valve regurgitation is not visualized. Mild aortic valve sclerosis is present, with no evidence of aortic valve stenosis. Mild aortic valve annular calcification. There is mild thickening of the aortic valve. There is mild calcification of the aortic valve. Pulmonic Valve: The pulmonic valve was normal in structure. Pulmonic valve regurgitation is mild. Aorta: The aortic root is normal in size and structure. There is mild (Grade II) atheroma plaque involving the aortic root and ascending aorta. Venous: The inferior vena cava is normal in size with greater than 50% respiratory variability, suggesting right atrial pressure of 3 mmHg. IAS/Shunts: No atrial level shunt detected by color flow Doppler.  LEFT VENTRICLE PLAX 2D LVIDd:         4.00 cm  Diastology LVIDs:         2.80 cm  LV e' lateral:   10.10 cm/s LV PW:         1.10 cm  LV E/e' lateral: 5.0 LV IVS:        1.10 cm  LV e' medial:    7.72 cm/s LVOT diam:     2.40 cm  LV E/e' medial:  6.6 LV SV:         119 LV SV Index:   58 LVOT Area:     4.52 cm  RIGHT VENTRICLE RV S prime:     15.30 cm/s TAPSE (M-mode): 2.3 cm LEFT ATRIUM             Index       RIGHT ATRIUM           Index LA diam:        4.20 cm 2.03 cm/m  RA Area:     13.40 cm LA Vol (A2C):   55.9 ml 26.96 ml/m RA Volume:   26.70 ml  12.88 ml/m LA Vol (A4C):   55.0 ml 26.52 ml/m LA Biplane Vol: 56.4 ml 27.20 ml/m  AORTIC VALVE LVOT Vmax:   110.00 cm/s LVOT Vmean:  72.300 cm/s LVOT VTI:    0.264 m  AORTA Ao Root diam: 2.80 cm MITRAL VALVE MV Area (PHT): 2.73 cm    SHUNTS MV Decel Time: 278 msec    Systemic VTI:  0.26 m MV E velocity: 50.75 cm/s  Systemic Diam: 2.40 cm MV A velocity: 81.50 cm/s MV E/A ratio:  0.62 Dixie Dials MD Electronically signed  by Dixie Dials MD Signature Date/Time: 03/03/2020/5:27:18 PM    Final         Scheduled Meds: .  stroke: mapping our early stages of recovery book   Does not apply Once  . amLODipine  5 mg Oral Daily  . aspirin EC  81 mg Oral Daily  . atorvastatin  80 mg Oral Daily  . atropine  0.5 mg Intravenous Once  . clopidogrel  75 mg Oral Daily  . donepezil  5 mg Oral Daily  . enoxaparin (LOVENOX) injection  40 mg Subcutaneous Q24H  . famotidine  20 mg Oral BID  . glucagon (human recombinant)  5 mg Intravenous Once  . hydrALAZINE  50  mg Oral BID  . potassium chloride  10 mEq Oral Daily   Continuous Infusions: . sodium chloride 10 mL/hr at 03/05/20 0551     LOS: 1 day    Time spent: 35 minutes  No charge    Ramiro Harvest, MD [facility]   To contact the attending provider between 7A-7P or the covering provider during after hours 7P-7A, please log into the web site [email or web address] and access using universal [place] password for that web site. If you do not have the password, please call the hospital operator.  03/05/2020, 10:26 AM

## 2020-03-06 DIAGNOSIS — Z7901 Long term (current) use of anticoagulants: Secondary | ICD-10-CM

## 2020-03-06 DIAGNOSIS — F10230 Alcohol dependence with withdrawal, uncomplicated: Secondary | ICD-10-CM

## 2020-03-06 DIAGNOSIS — F1090 Alcohol use, unspecified, uncomplicated: Secondary | ICD-10-CM

## 2020-03-06 DIAGNOSIS — Z789 Other specified health status: Secondary | ICD-10-CM

## 2020-03-06 LAB — BASIC METABOLIC PANEL
Anion gap: 10 (ref 5–15)
BUN: 15 mg/dL (ref 8–23)
CO2: 24 mmol/L (ref 22–32)
Calcium: 9.8 mg/dL (ref 8.9–10.3)
Chloride: 108 mmol/L (ref 98–111)
Creatinine, Ser: 1.1 mg/dL (ref 0.61–1.24)
GFR calc Af Amer: >60 mL/min (ref >60)
GFR calc non Af Amer: >60 mL/min (ref >60)
Glucose, Bld: 104 mg/dL — ABNORMAL HIGH (ref 70–99)
Potassium: 3.5 mmol/L (ref 3.5–5.1)
Sodium: 142 mmol/L (ref 135–145)

## 2020-03-06 LAB — MAGNESIUM: Magnesium: 1.7 mg/dL (ref 1.7–2.4)

## 2020-03-06 LAB — HEPATIC FUNCTION PANEL
ALT: 21 U/L (ref 0–44)
AST: 21 U/L (ref 15–41)
Albumin: 4 g/dL (ref 3.5–5.0)
Alkaline Phosphatase: 45 U/L (ref 38–126)
Bilirubin, Direct: 0.1 mg/dL (ref 0.0–0.2)
Indirect Bilirubin: 0.9 mg/dL (ref 0.3–0.9)
Total Bilirubin: 1 mg/dL (ref 0.3–1.2)
Total Protein: 6.9 g/dL (ref 6.5–8.1)

## 2020-03-06 LAB — AMMONIA: Ammonia: 39 umol/L — ABNORMAL HIGH (ref 9–35)

## 2020-03-06 LAB — TSH: TSH: 3.729 u[IU]/mL (ref 0.350–4.500)

## 2020-03-06 LAB — VITAMIN B12: Vitamin B-12: 448 pg/mL (ref 180–914)

## 2020-03-06 LAB — PHOSPHORUS: Phosphorus: 4.1 mg/dL (ref 2.5–4.6)

## 2020-03-06 LAB — FOLATE: Folate: 21.8 ng/mL (ref >5.9)

## 2020-03-06 MED ORDER — LACTULOSE 10 GM/15ML PO SOLN
20.0000 g | Freq: Once | ORAL | Status: AC
  Administered 2020-03-06: 20 g via ORAL
  Filled 2020-03-06: qty 30

## 2020-03-06 MED ORDER — ZOLPIDEM TARTRATE 5 MG PO TABS: 5.0000 mg | ORAL_TABLET | Freq: Every evening | ORAL | Status: DC | PRN

## 2020-03-06 MED ORDER — THIAMINE HCL 100 MG PO TABS
100.0000 mg | ORAL_TABLET | Freq: Every day | ORAL | Status: DC
  Administered 2020-03-07 – 2020-03-08 (×2): 100 mg via ORAL
  Filled 2020-03-06 (×2): qty 1

## 2020-03-06 MED ORDER — HYDROXYZINE HCL 25 MG PO TABS: 25.0000 mg | ORAL_TABLET | Freq: Four times a day (QID) | ORAL | Status: DC | PRN

## 2020-03-06 MED ORDER — CHLORDIAZEPOXIDE HCL 25 MG PO CAPS
25.0000 mg | ORAL_CAPSULE | Freq: Three times a day (TID) | ORAL | Status: AC
  Administered 2020-03-07 (×3): 25 mg via ORAL
  Filled 2020-03-06 (×3): qty 1

## 2020-03-06 MED ORDER — CHLORDIAZEPOXIDE HCL 25 MG PO CAPS
25.0000 mg | ORAL_CAPSULE | ORAL | Status: DC
  Administered 2020-03-08: 25 mg via ORAL
  Filled 2020-03-06: qty 1

## 2020-03-06 MED ORDER — CHLORDIAZEPOXIDE HCL 25 MG PO CAPS
25.0000 mg | ORAL_CAPSULE | Freq: Four times a day (QID) | ORAL | Status: AC
  Administered 2020-03-06 (×4): 25 mg via ORAL
  Filled 2020-03-06 (×4): qty 1

## 2020-03-06 MED ORDER — LOPERAMIDE HCL 2 MG PO CAPS: 2.0000 mg | ORAL_CAPSULE | ORAL | Status: DC | PRN

## 2020-03-06 MED ORDER — MAGNESIUM SULFATE 4 GM/100ML IV SOLN
4.0000 g | Freq: Once | INTRAVENOUS | Status: AC
  Administered 2020-03-06: 4 g via INTRAVENOUS
  Filled 2020-03-06: qty 100

## 2020-03-06 MED ORDER — CHLORDIAZEPOXIDE HCL 25 MG PO CAPS: 25.0000 mg | ORAL_CAPSULE | Freq: Four times a day (QID) | ORAL | Status: DC | PRN

## 2020-03-06 MED ORDER — ONDANSETRON 4 MG PO TBDP: 4.0000 mg | ORAL_TABLET | Freq: Four times a day (QID) | ORAL | Status: DC | PRN

## 2020-03-06 MED ORDER — CHLORDIAZEPOXIDE HCL 25 MG PO CAPS: 25.0000 mg | ORAL_CAPSULE | Freq: Every day | ORAL | Status: DC

## 2020-03-06 NOTE — Progress Notes (Addendum)
PROGRESS NOTE    David Cherry  WUX:[id number] DOB: [birth year] DOA: 03/03/2020 PCP: Patient, No Pcp Per    Chief Complaint  Patient presents with  . Code Stroke    Brief Narrative:  HPI per Dr. Ronnie Derby is a 74 y.o. male with medical history significant of CAD s/p stent, HTN.  Pt brought in [place] ED [place] with confusion, aphasia, R sided weakness.  Some coumadin found in his car.  Per patient hasnt been taking med for the past month.  History from patient not really great at this point due to a mixed wernickie+broca aphasia.  For example when a word is used with the patient he will start randomly putting that word into sentences: ie "I live in coumadin".  Makes history taking very challenging.  Family doesn't know much about his medical history when ED called them.   ED Course: In ED pt seen by neurology as a code stroke, not TPAed due to unk last well.  MRI brain neg for acute stroke findings.  Per Dr. Laurence Slate consider TIA vs seizure with post-ictal state.  Assessment & Plan:   Principal Problem:   Stroke-like symptoms Active Problems:   CAD (coronary artery disease)   Chronic anticoagulation   Bradycardia   Sinus pause   Alcohol use   Alcohol dependence with uncomplicated withdrawal (HCC)  1 rule out CVA/strokelike symptoms Patient presenting with right-sided weakness, right facial droop and noted to be aphasic.  Patient with improvement with aphasia as well as facial droop.  Head CT done on admission negative for any acute abnormalities.  CT angiogram head and neck which was done was negative for any large vessel occlusion.  tPA not given as patient unclear as to last known normal.  MRI which was done in intracranial process, multiple old small vessel infarcts and findings of mild chronic small vessel disease noted.  2D echo ordered with EF of 55 to 60%, no wall motion abnormalities, grade 1 diastolic dysfunction, no source  of emboli noted.  2D echo however did note a grade 2 atheroma plaque involving the aortic root and ascending aorta.  Fasting lipid panel with LDL of 53.  Neurology consulted and following.  Patient loaded with Keppra due to concerns for possible seizure versus post ictal state.  EEG also ordered and done which showed a normal study with no seizures or epileptiform discharges seen throughout the recording.  Continue aspirin, Plavix, statin.  Continue current regimen of Norvasc and hydralazine for blood pressure control.  PT/OT/ST.  Continue IV thiamine 500 mg every 8 hours x1 more day.  Repeat diffusion-weighted MRI done negative.  Per neurology given patient's resolution of symptoms and negative stroke work-up is felt no further neurological diagnostic testing needed at this time and outpatient follow-up with neurology.  Patient with some cognitive deficits and concern for safe disposition as patient currently refusing SNF.  Social worker working diligently to see if patient will be able to have 24-hour supervision and safe disposition back to his home in [place].  Outpatient follow-up with neurology.  2.  Bradycardia/sinus pauses Patient noted on presentation to the ED to have some severe bradycardia when asleep and less severe when patient was awake.  It was noted her heart rate would drop into the 30s while patient was asleep.  Unknown as to what specific medications patient is on in terms of a nodal blocking agents.  It was noted that when patient not having pauses  heart rate went almost up to 100.  Cardiology consulted and patient seen in consultation by Dr. Algie Coffer.  2D echo was ordered with normal EF of 55 to 60%, no wall motion abnormalities, grade 1 diastolic dysfunction.  Per cardiology it is noted that patient has been off antiplatelets and anticoagulants for over 6 months and after 6 months use.  Cardiology recommending discontinuation of patient's clonidine, Coreg.  Outpatient follow-up with  patient's cardiologist in [place].    3.  Hypertension Stable.  Continue hydralazine and Norvasc.   4.  Chronic anticoagulation/coronary artery disease 2D echo done with a EF of 55 to 60%, no wall motion abnormalities, grade 1 diastolic dysfunction.  Patient denies any ongoing chest pain.  It was noted that patient was on anticoagulation with Coumadin for up until a month ago however noted to have stopped taking Coumadin.  INR at 1.0.  Continue aspirin, Lipitor, hydralazine, Norvasc, Plavix as recommended per cardiology.  Cardiology recommended discontinuation of Coumadin.  Cardiology following.  5.  Elevated creatinine Creatinine noted to be approximately 1.68 on presentation.  Creatinine improved and currently at 1.10.  Saline lock IV fluids.  Follow.   6. Alcohol dependence with uncomplicated withdrawal Patient initially on presentation denied any alcohol use.  Patient when asked how much alcohol he drinks daily he states he drinks about 2-3 drinks daily that he is done for several years.  Patient states drinks mainly vodka and had to change to wine when he was placed on Coumadin.  Patient noted to have some tremors this morning and received IV Ativan per RN.  Continue the Ativan withdrawal protocol.  Placed on the Librium detox protocol.  Continue thiamine, folic acid, multivitamin.  Status post 3 days thiamine 500 mg IV every 8 hours.  Supportive care.  Follow.  7.  Intermittent confusion Patient noted to have some intermittent confusion from time to time.  Concern patient may have a history of alcohol use however patient denied any alcohol on admission.  When asked again this morning how many drinks he drinks a day patient stated drinks about 2-3 drinks a day that he is done for a long time and is mainly vodka.  Patient stated he had to change to wine when he was on Coumadin.  Patient noted to have some fine tremors this morning per RN and received IV Ativan of the Ativan withdrawal protocol.   Folate level 21.8.  Vitamin B12 at 448.  TSH within normal limits at 3.729.  Ammonia level slightly elevated at 39.  Status post 3 days of thiamine 500 mg IV every 8 hours.  Continue thiamine, folic acid, multivitamin.  Patient on the Ativan withdrawal protocol.  Will place on the Librium detox protocol.  Lactulose 20 g p.o. x1.  Follow.      DVT prophylaxis: Lovenox Code Status: Full Family Communication: Updated patient.  No family at bedside. Disposition:   Status is: Patient    Dispo: The patient is from: Home              Anticipated d/c is to: Home              Anticipated d/c date is: Once safe disposition obtained with 24-hour supervision on discharge.              Patient currently with some cognitive deficits/memory issues and likely needs 24-hour supervision for safe disposition as patient refusing SNF at this time.  Social work trying to Systems analyst 24-hour supervision and safe  discharge/disposition for patient to get back home to [place].  Patient also with alcohol dependence and likely starting to go into early alcohol withdrawal.  Currently unsafe disposition at this time.        Consultants:   Cardiology: Dr. Algie Coffer 03/03/2020  Neurology: Dr.Aroor 03/03/2020  Procedures:   CT head 03/03/2020  2D echo 03/03/2020  MRI 03/03/2020  Chest x-ray 03/03/2020  CT angiogram head and neck 03/03/2020  EEG 03/03/2020  Antimicrobials:  None   Subjective: Patient in bed.  Patient noted to have increasing tremors this morning per RN and was given IV Ativan.  Patient denies any chest pain.  No shortness of breath.  Patient alert and oriented to self and time.  Knows he is in the hospital however thinks he is in [place].  Patient does state he is drank 2-3 drinks daily for a long time and asking whether 1 still receives alcohol when hospitalized to prevent withdrawal symptoms.    Objective: Vitals:   03/06/20 0347 03/06/20 0733 03/06/20 0755 03/06/20  0900  BP: (!) 151/85 (!) 152/90 (!) 150/86 (!) 148/88  Pulse: 76 71 72 70  Resp: 17 18    Temp: 98.1 F (36.7 C) 98.3 F (36.8 C)    TempSrc: Oral Oral    SpO2: 98% 97%    Weight:      Height:        Intake/Output Summary (Last 24 hours) at 03/06/2020 1129 Last data filed at 03/06/2020 0900 Gross per 24 hour  Intake 700 ml  Output 1875 ml  Net -1175 ml   Filed Weights   03/03/20 0101  Weight: 91.5 kg    Examination:  General exam: NAD Respiratory system: Lungs clear to auscultation bilaterally.  No wheezes, no crackles, no rhonchi.  Normal respiratory effort.  Speaking in full sentences.  Cardiovascular system: RRR no murmurs rubs or gallops.  No JVD.  No lower extremity edema.  Gastrointestinal system: Abdomen is nontender, nondistended, soft, positive bowel sounds.  No rebound.  No guarding.  Central nervous system: Alert and oriented.  Moving extremities spontaneously.  Extremities: Symmetric 5 x 5 power. Skin: No rashes, lesions or ulcers Psychiatry: Judgement and insight appear fair. Mood & affect appropriate.     Data Reviewed: I have personally reviewed following labs and imaging studies  CBC: Recent Labs  Lab 03/03/20 0057 03/03/20 0102 03/04/20 0458  WBC 8.9  --  5.8  NEUTROABS 7.0  --   --   HGB 14.3 13.3 12.4*  HCT 43.2 39.0 36.5*  MCV 96.0  --  94.3  PLT 127*  --  116*    Basic Metabolic Panel: Recent Labs  Lab 03/03/20 0057 03/03/20 0102 03/04/20 0458 03/05/20 0720 03/06/20 0348  NA 144 145 141 139 142  K 3.4* 3.4* 3.4* 3.7 3.5  CL 112* 113* 108 110 108  CO2 19*  --  25 23 24   GLUCOSE 143* 142* 98 92 104*  BUN 22 23 19 13 15   CREATININE 1.68* 1.70* 1.30* 1.09 1.10  CALCIUM 9.6  --  8.8* 9.2 9.8  MG  --   --   --   --  1.7  PHOS  --   --   --   --  4.1    GFR: Estimated Creatinine Clearance: 67.8 mL/min (by C-G formula based on SCr of 1.1 mg/dL).  Liver Function Tests: Recent Labs  Lab 03/03/20 0057 03/06/20 0411  AST 23 21    ALT 19 21  ALKPHOS 46 45  BILITOT 1.6* 1.0  PROT 7.2 6.9  ALBUMIN 4.4 4.0    CBG: Recent Labs  Lab 03/03/20 0053  GLUCAP 133*     Recent Results (from the past 240 hour(s))  SARS Coronavirus 2 by RT PCR (hospital order, performed in [facility] hospital lab) Nasopharyngeal Nasopharyngeal Swab     Status: None   Collection Time: 03/03/20  1:24 AM   Specimen: Nasopharyngeal Swab  Result Value Ref Range Status   SARS Coronavirus 2 NEGATIVE NEGATIVE Final    Comment: (NOTE) SARS-CoV-2 target nucleic acids are NOT DETECTED. The SARS-CoV-2 RNA is generally detectable in upper and lower respiratory specimens during the acute phase of infection. The lowest concentration of SARS-CoV-2 viral copies this assay can detect is 250 copies / mL. A negative result does not preclude SARS-CoV-2 infection and should not be used as the sole basis for treatment or other patient management decisions.  A negative result may occur with improper specimen collection / handling, submission of specimen other than nasopharyngeal swab, presence of viral mutation(s) within the areas targeted by this assay, and inadequate number of viral copies (<250 copies / mL). A negative result must be combined with clinical observations, patient history, and epidemiological information. Fact Sheet for Patients:   StrictlyIdeas.no Fact Sheet for Healthcare Providers: BankingDealers.co.za This test is not yet approved or cleared  by the [place] FDA and has been authorized for detection and/or diagnosis of SARS-CoV-2 by FDA under an Emergency Use Authorization (EUA).  This EUA will remain in effect (meaning this test can be used) for the duration of the COVID-19 declaration under Section 564(b)(1) of the Act, 21 U.S.C. section 360bbb-3(b)(1), unless the authorization is terminated or revoked sooner. Performed at [facility], [address] [place],  [place] [address]          Radiology Studies: No results found.      Scheduled Meds: .  stroke: mapping our early stages of recovery book   Does not apply Once  . amLODipine  5 mg Oral Daily  . aspirin EC  81 mg Oral Daily  . atorvastatin  80 mg Oral Daily  . atropine  0.5 mg Intravenous Once  . chlordiazePOXIDE  25 mg Oral QID   Followed by  . [START ON 03/07/2020] chlordiazePOXIDE  25 mg Oral TID   Followed by  . [START ON 03/08/2020] chlordiazePOXIDE  25 mg Oral BH-qamhs   Followed by  . [START ON 03/09/2020] chlordiazePOXIDE  25 mg Oral Daily  . clopidogrel  75 mg Oral Daily  . donepezil  5 mg Oral Daily  . enoxaparin (LOVENOX) injection  40 mg Subcutaneous Q24H  . famotidine  20 mg Oral BID  . folic acid  1 mg Oral Daily  . glucagon (human recombinant)  5 mg Intravenous Once  . hydrALAZINE  50 mg Oral BID  . multivitamin with minerals  1 tablet Oral Daily  . potassium chloride  10 mEq Oral Daily  . thiamine  100 mg Oral Daily   Or  . thiamine  100 mg Intravenous Daily  . [START ON 03/07/2020] thiamine  100 mg Oral Daily   Continuous Infusions: . magnesium sulfate bolus IVPB 4 g (03/06/20 0934)     LOS: 2 days    Time spent: 35 minutes  No charge    Irine Seal, MD [facility]   To contact the attending provider between 7A-7P or the covering provider during after hours 7P-7A, please  log into the web site [email or web address] and access using universal [place] password for that web site. If you do not have the password, please call the hospital operator.  03/06/2020, 11:29 AM

## 2020-03-07 LAB — CBC
HCT: 43.1 % (ref 39.0–52.0)
Hemoglobin: 14.5 g/dL (ref 13.0–17.0)
MCH: 31.3 pg (ref 26.0–34.0)
MCHC: 33.6 g/dL (ref 30.0–36.0)
MCV: 93.1 fL (ref 80.0–100.0)
Platelets: 127 10*3/uL — ABNORMAL LOW (ref 150–400)
RBC: 4.63 MIL/uL (ref 4.22–5.81)
RDW: 12.8 % (ref 11.5–15.5)
WBC: 7.1 10*3/uL (ref 4.0–10.5)
nRBC: 0 % (ref 0.0–0.2)

## 2020-03-07 LAB — BASIC METABOLIC PANEL
Anion gap: 8 (ref 5–15)
BUN: 17 mg/dL (ref 8–23)
CO2: 26 mmol/L (ref 22–32)
Calcium: 9.5 mg/dL (ref 8.9–10.3)
Chloride: 106 mmol/L (ref 98–111)
Creatinine, Ser: 1.14 mg/dL (ref 0.61–1.24)
GFR calc Af Amer: >60 mL/min (ref >60)
GFR calc non Af Amer: >60 mL/min (ref >60)
Glucose, Bld: 105 mg/dL — ABNORMAL HIGH (ref 70–99)
Potassium: 3.7 mmol/L (ref 3.5–5.1)
Sodium: 140 mmol/L (ref 135–145)

## 2020-03-07 LAB — MAGNESIUM: Magnesium: 2.1 mg/dL (ref 1.7–2.4)

## 2020-03-07 NOTE — Progress Notes (Signed)
0400 vital signs and 0500 lab draw were deferred because  Pt had requested not to be disturbed in the early morning hours

## 2020-03-07 NOTE — Progress Notes (Signed)
PROGRESS NOTE    David Cherry  QHU:[id number] DOB: 1946-05-19 DOA: 03/03/2020 PCP: David Cherry, No Pcp Per    Chief Complaint  David Cherry presents with  . Code Stroke    Brief Narrative:  HPI per Dr. Ronnie Derby is a 74 y.o. male with medical history significant of CAD s/p stent, HTN.  Pt [place] in [place] ED [place] with confusion, aphasia, R sided weakness.  Some coumadin found in his car.  Per David Cherry hasnt been taking med for the past month.  History from David Cherry not really great at this point due to a mixed wernickie+broca aphasia.  For example when a word is used with the David Cherry he will start randomly putting that word into sentences: ie "I live in coumadin".  Makes history taking very challenging.  Family doesn't know much about his medical history when ED called them.   ED Course: In ED pt seen by neurology as a code stroke, not TPAed due to unk last well.  MRI brain neg for acute stroke findings.  Per Dr. Laurence Slate consider TIA vs seizure with post-ictal state.  Assessment & Plan:   Principal Problem:   Stroke-like symptoms Active Problems:   CAD (coronary artery disease)   Chronic anticoagulation   Bradycardia   Sinus pause   Alcohol use   Alcohol dependence with uncomplicated withdrawal (HCC)  1 rule out CVA/strokelike symptoms David Cherry presenting with right-sided weakness, right facial droop and noted to be aphasic.  David Cherry with improvement with aphasia as well as facial droop.  Head CT done on admission negative for any acute abnormalities.  CT angiogram head and neck which was done was negative for any large vessel occlusion.  tPA not given as David Cherry unclear as to last known normal.  MRI which was done in intracranial process, multiple old small vessel infarcts and findings of mild chronic small vessel disease noted.  2D echo ordered with EF of 55 to 60%, no wall motion abnormalities, grade 1 diastolic dysfunction, no source  of emboli noted.  2D echo however did note a grade 2 atheroma plaque involving the aortic root and ascending aorta.  Fasting lipid panel with LDL of 53.  Neurology consulted and following.  David Cherry loaded with Keppra due to concerns for possible seizure versus post ictal state.  EEG also ordered and done which showed a normal study with no seizures or epileptiform discharges seen throughout the recording.  Continue aspirin, Plavix, statin.  Continue current regimen of Norvasc and hydralazine for blood pressure control.  PT/OT/ST.  s/p IV thiamine 500 mg every 8 hours x 3 days.  Repeat diffusion-weighted MRI done negative.  Per neurology given David Cherry's resolution of symptoms and negative stroke work-up is felt no further neurological diagnostic testing needed at this time and outpatient follow-up with neurology.  David Cherry with some cognitive deficits and concern for safe disposition as David Cherry currently refusing SNF.  Social worker working diligently to see if David Cherry will be able to have 24-hour supervision and safe disposition back to his home in [place].  Outpatient follow-up with neurology.  2.  Bradycardia/sinus pauses David Cherry noted on presentation to the ED to have some severe bradycardia when asleep and less severe when David Cherry was awake.  It was noted her heart rate would drop into the 30s while David Cherry was asleep.  Unknown as to what specific medications David Cherry is on in terms of a nodal blocking agents.  It was noted that when David Cherry not having pauses  heart rate went almost up to 100.  Cardiology consulted and David Cherry seen in consultation by Dr. Algie Coffer.  2D echo was ordered with normal EF of 55 to 60%, no wall motion abnormalities, grade 1 diastolic dysfunction.  Per cardiology it is noted that David Cherry has been off antiplatelets and anticoagulants for over 6 months and after 6 months use.  Cardiology recommending discontinuation of David Cherry's clonidine, Coreg.  Outpatient follow-up with David Cherry's  cardiologist in [place].    3.  Hypertension Norvasc, hydralazine.   4.  Chronic anticoagulation/coronary artery disease 2D echo done with a EF of 55 to 60%, no wall motion abnormalities, grade 1 diastolic dysfunction.  David Cherry denies any ongoing chest pain.  It was noted that David Cherry was on anticoagulation with Coumadin for up until a month ago however noted to have stopped taking Coumadin.  INR at 1.0.  Continue aspirin, Lipitor, hydralazine, Norvasc, Plavix as recommended per cardiology.  Cardiology recommended discontinuation of Coumadin.  Cardiology following.  5.  Elevated creatinine Creatinine noted to be approximately 1.68 on presentation.  Creatinine improved and currently at 1.14.  Saline lock IV fluids.  Follow.   6. Alcohol dependence with uncomplicated withdrawal David Cherry initially on presentation denied any alcohol use.  David Cherry when asked how much alcohol he drinks daily he states he drinks about 2-3 drinks daily that he is done for several years.  David Cherry states drinks mainly vodka and had to change to wine when he was placed on Coumadin.  David Cherry noted to have some tremors the morning of 03/06/2020 and received IV Ativan per RN.  Continue the Ativan withdrawal protocol.  Continue Librium detox protocol.  Continue thiamine, folic acid, multivitamin.  Status post 3 days thiamine 500 mg IV every 8 hours.  Supportive care.  Follow.  7.  Intermittent confusion David Cherry noted to have some intermittent confusion from time to time.  Concern David Cherry may have a history of alcohol use however David Cherry denied any alcohol on admission.  When asked, how many drinks he drinks a day David Cherry stated drinks about 2-3 drinks a day that he has done this for a long time and is mainly vodka.  David Cherry stated he had to change to wine when he was on Coumadin.  David Cherry noted to have some fine tremors the morning of 03/06/2020 per RN and received IV Ativan of the Ativan withdrawal protocol.  Folate level 21.8.   Vitamin B12 at 448.  TSH within normal limits at 3.729.  Ammonia level slightly elevated at 39.  Status post 3 days of thiamine 500 mg IV every 8 hours.  Continue thiamine, folic acid, multivitamin.  David Cherry on the Ativan withdrawal protocol and Librium detox protocol.  Status post 1 dose lactulose.  Follow.      DVT prophylaxis: Lovenox Code Status: Full Family Communication: Updated David Cherry.  No family at bedside. Disposition:   Status is: David Cherry    Dispo: The David Cherry is from: Home              Anticipated d/c is to: Home              Anticipated d/c date is: Once safe disposition obtained with 24-hour supervision on discharge.              David Cherry currently with some cognitive deficits/memory issues and likely needs 24-hour supervision for safe disposition as David Cherry refusing SNF at this time.  Social work trying to contact family/arrange 24-hour supervision and safe discharge/disposition for David Cherry to get back home to  [place].  David Cherry also with alcohol dependence and on Librium detox protocol.  On Ativan withdrawal protocol.        Consultants:   Cardiology: Dr. Algie CofferKadakia 03/03/2020  Neurology: Dr.Aroor 03/03/2020  Procedures:   CT head 03/03/2020  2D echo 03/03/2020  MRI 03/03/2020  Chest x-ray 03/03/2020  CT angiogram head and neck 03/03/2020  EEG 03/03/2020  Antimicrobials:  None   Subjective: David Cherry laying in bed.  Stated he slept well last night.  Appreciated for being on Librium.  Denies any chest pain.  No shortness of breath.  States he is ready to be discharged home tomorrow.  David Cherry stated he will be driven by the total company with [place].  Objective: Vitals:   03/06/20 1539 03/06/20 1938 03/06/20 2326 03/07/20 0758  BP: (!) 131/91 (!) 142/90 116/68 (!) 153/93  Pulse: 88 80 68 64  Resp: 18 19 19 18   Temp: 98.4 F (36.9 C) 98.1 F (36.7 C) 98.5 F (36.9 C) 97.9 F (36.6 C)  TempSrc: Oral Oral Oral Oral  SpO2: 95% 96% 96% 98%  Weight:       Height:        Intake/Output Summary (Last 24 hours) at 03/07/2020 1130 Last data filed at 03/07/2020 1103 Gross per 24 hour  Intake 560 ml  Output 1500 ml  Net -940 ml   Filed Weights   03/03/20 0101  Weight: 91.5 kg    Examination:  General exam: NAD.  Respiratory system: CTAB.  No wheezes, no crackles, no rhonchi.  Normal respiratory effort.  Speaking in full sentences.  Cardiovascular system: Regular rate rhythm no murmurs rubs or gallops.  No JVD.  No lower extremity edema.  Gastrointestinal system: Abdomen is soft, nontender, nondistended, positive bowel sounds.  No rebound.  No guarding.   Central nervous system: Alert and oriented.  Moving extremities spontaneously.  Extremities: Symmetric 5 x 5 power. Skin: No rashes, lesions or ulcers Psychiatry: Judgement and insight appear fair. Mood & affect appropriate.     Data Reviewed: I have personally reviewed following labs and imaging studies  CBC: Recent Labs  Lab 03/03/20 0057 03/03/20 0102 03/04/20 0458 03/07/20 0727  WBC 8.9  --  5.8 7.1  NEUTROABS 7.0  --   --   --   HGB 14.3 13.3 12.4* 14.5  HCT 43.2 39.0 36.5* 43.1  MCV 96.0  --  94.3 93.1  PLT 127*  --  116* 127*    Basic Metabolic Panel: Recent Labs  Lab 03/03/20 0057 03/03/20 0057 03/03/20 0102 03/04/20 0458 03/05/20 0720 03/06/20 0348 03/07/20 0727  NA 144   < > 145 141 139 142 140  K 3.4*   < > 3.4* 3.4* 3.7 3.5 3.7  CL 112*   < > 113* 108 110 108 106  CO2 19*  --   --  25 23 24 26   GLUCOSE 143*   < > 142* 98 92 104* 105*  BUN 22   < > 23 19 13 15 17   CREATININE 1.68*   < > 1.70* 1.30* 1.09 1.10 1.14  CALCIUM 9.6  --   --  8.8* 9.2 9.8 9.5  MG  --   --   --   --   --  1.7 2.1  PHOS  --   --   --   --   --  4.1  --    < > = values in this interval not displayed.    GFR: Estimated Creatinine  Clearance: 65.4 mL/min (by C-G formula based on SCr of 1.14 mg/dL).  Liver Function Tests: Recent Labs  Lab 03/03/20 0057 03/06/20 0411    AST 23 21  ALT 19 21  ALKPHOS 46 45  BILITOT 1.6* 1.0  PROT 7.2 6.9  ALBUMIN 4.4 4.0    CBG: Recent Labs  Lab 03/03/20 0053  GLUCAP 133*     Recent Results (from the past 240 hour(s))  SARS Coronavirus 2 by RT PCR (hospital order, performed in [facility] hospital lab) Nasopharyngeal Nasopharyngeal Swab     Status: None   Collection Time: 03/03/20  1:24 AM   Specimen: Nasopharyngeal Swab  Result Value Ref Range Status   SARS Coronavirus 2 NEGATIVE NEGATIVE Final    Comment: (NOTE) SARS-CoV-2 target nucleic acids are NOT DETECTED. The SARS-CoV-2 RNA is generally detectable in upper and lower respiratory specimens during the acute phase of infection. The lowest concentration of SARS-CoV-2 viral copies this assay can detect is 250 copies / mL. A negative result does not preclude SARS-CoV-2 infection and should not be used as the sole basis for treatment or other David Cherry management decisions.  A negative result may occur with improper specimen collection / handling, submission of specimen other than nasopharyngeal swab, presence of viral mutation(s) within the areas targeted by this assay, and inadequate number of viral copies (<250 copies / mL). A negative result must be combined with clinical observations, David Cherry history, and epidemiological information. Fact Sheet for Patients:   StrictlyIdeas.no Fact Sheet for Healthcare Providers: BankingDealers.co.za This test is not yet approved or cleared  by the [place] FDA and has been authorized for detection and/or diagnosis of SARS-CoV-2 by FDA under an Emergency Use Authorization (EUA).  This EUA will remain in effect (meaning this test can be used) for the duration of the COVID-19 declaration under Section 564(b)(1) of the Act, 21 U.S.C. section 360bbb-3(b)(1), unless the authorization is terminated or revoked sooner. Performed at [facility], [place] [address]  [place], [place] [address]          Radiology Studies: No results found.      Scheduled Meds: .  stroke: mapping our early stages of recovery book   Does not apply Once  . amLODipine  5 mg Oral Daily  . aspirin EC  81 mg Oral Daily  . atorvastatin  80 mg Oral Daily  . atropine  0.5 mg Intravenous Once  . chlordiazePOXIDE  25 mg Oral TID   Followed by  . [START ON 03/08/2020] chlordiazePOXIDE  25 mg Oral BH-qamhs   Followed by  . [START ON 03/09/2020] chlordiazePOXIDE  25 mg Oral Daily  . clopidogrel  75 mg Oral Daily  . donepezil  5 mg Oral Daily  . enoxaparin (LOVENOX) injection  40 mg Subcutaneous Q24H  . famotidine  20 mg Oral BID  . folic acid  1 mg Oral Daily  . glucagon (human recombinant)  5 mg Intravenous Once  . hydrALAZINE  50 mg Oral BID  . multivitamin with minerals  1 tablet Oral Daily  . potassium chloride  10 mEq Oral Daily  . thiamine  100 mg Oral Daily   Continuous Infusions:    LOS: 3 days    Time spent: 35 minutes  No charge    Irine Seal, MD [facility]   To contact the attending provider between 7A-7P or the covering provider during after hours 7P-7A, please log into the web site [email or web address] and access using universal [place]  password for that web site. If you do not have the password, please call the hospital operator.  03/07/2020, 11:30 AM

## 2020-03-08 DIAGNOSIS — Z7289 Other problems related to lifestyle: Secondary | ICD-10-CM

## 2020-03-08 LAB — BASIC METABOLIC PANEL
Anion gap: 8 (ref 5–15)
BUN: 18 mg/dL (ref 8–23)
CO2: 26 mmol/L (ref 22–32)
Calcium: 9.5 mg/dL (ref 8.9–10.3)
Chloride: 105 mmol/L (ref 98–111)
Creatinine, Ser: 1.03 mg/dL (ref 0.61–1.24)
GFR calc Af Amer: >60 mL/min (ref >60)
GFR calc non Af Amer: >60 mL/min (ref >60)
Glucose, Bld: 154 mg/dL — ABNORMAL HIGH (ref 70–99)
Potassium: 3.5 mmol/L (ref 3.5–5.1)
Sodium: 139 mmol/L (ref 135–145)

## 2020-03-08 MED ORDER — ADULT MULTIVITAMIN W/MINERALS CH: 1.0000 | ORAL_TABLET | Freq: Every day | ORAL | Status: AC

## 2020-03-08 MED ORDER — THIAMINE HCL 100 MG PO TABS: 100.0000 mg | ORAL_TABLET | Freq: Every day | ORAL | Status: AC

## 2020-03-08 MED ORDER — FOLIC ACID 1 MG PO TABS: 1.0000 mg | ORAL_TABLET | Freq: Every day | ORAL | Status: AC

## 2020-03-08 NOTE — Progress Notes (Signed)
Physical Therapy Treatment Patient Details Name: David Cherry MRN: [id number] DOB: Apr 09, 1946 Today's Date: 03/08/2020    History of Present Illness Pt is a 74 year old man admitted on 03/03/20 after [place] confused with R side weakness, facial droop and aphasia. 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 PMH: HTN, CAD     PT Comments    Pt progressing towards physical therapy goals. He was able to perform transfers and ambulation with gross supervision for safety. I am concerned about ongoing decreased safety awareness and decreased awareness of deficits, however from a mobility standpoint, he is improving. Pt is under the impression that the hospital is going to arrange for him to go to the bank today before noon, and was asking for a staff member to come with him. Attempted to reorient to situation, however pt insistent that this was a plan that he and the CSW had discussed. Continue to recommend 24 hour support upon return home as pt lives alone. Will continue to follow and progress as able per POC.     Follow Up Recommendations  SNF;Supervision/Assistance - 24 hour     Equipment Recommendations  None recommended by PT    Recommendations for Other Services       Precautions / Restrictions Precautions Precautions: Fall Restrictions Weight Bearing Restrictions: No    Mobility  Bed Mobility               General bed mobility comments: Pt was received sitting up EOB with NT present.   Transfers Overall transfer level: Needs assistance Equipment used: Rolling walker (2 wheeled) Transfers: Sit to/from Stand Sit to Stand: Supervision         General transfer comment: No assist required. Pt was able to power-up to full stand without difficulty and demonstrated proper hand placement on seated surface for safety.   Ambulation/Gait Ambulation/Gait assistance: Supervision Gait Distance  (Feet): 250 Feet Assistive device: Rolling walker (2 wheeled) Gait Pattern/deviations: Step-through pattern;Decreased stride length;Drifts right/left Gait velocity: Decreased Gait velocity interpretation: <1.8 ft/sec, indicate of risk for recurrent falls General Gait Details: Pt requesting to stop and sit down in the hallway. When asked if he felt he couldnt make it back to his room, the pt reports he could in fact make it back, and continued on without incident. No overt LOB noted however pt appears mildly unsteady at times. No assist required throughout gait training.    Stairs             Wheelchair Mobility    Modified Rankin (Stroke Patients Only)       Balance Overall balance assessment: Needs assistance Sitting-balance support: No upper extremity supported;Feet supported Sitting balance-Leahy Scale: Good     Standing balance support: Bilateral upper extremity supported;During functional activity Standing balance-Leahy Scale: Poor Standing balance comment: reliant on B UEs and external support                             Cognition Arousal/Alertness: Awake/alert Behavior During Therapy: WFL for tasks assessed/performed Overall Cognitive Status: Impaired/Different from baseline Area of Impairment: Memory;Safety/judgement;Problem solving                   Current Attention Level: Alternating Memory: Decreased short-term memory;Decreased recall of precautions Following Commands: Follows multi-step commands inconsistently;Follows one step commands consistently;Follows multi-step commands with increased time Safety/Judgement: Decreased awareness of deficits;Decreased  awareness of safety Awareness: Emergent Problem Solving: Decreased initiation;Difficulty sequencing General Comments: Continues to demonstrate poor insight to deficits and safety. Discussed return home and pt reports once he gets home he can start driving, etc.      Exercises       General Comments        Pertinent Vitals/Pain Pain Assessment: No/denies pain Faces Pain Scale: No hurt    Home Living                      Prior Function            PT Goals (current goals can now be found in the care plan section) Acute Rehab PT Goals Patient Stated Goal: drive home to [place] PT Goal Formulation: With patient Time For Goal Achievement: 03/17/20 Potential to Achieve Goals: Good Progress towards PT goals: Progressing toward goals    Frequency    Min 2X/week      PT Plan Current plan remains appropriate    Co-evaluation              AM-PAC PT "6 Clicks" Mobility   Outcome Measure  Help needed turning from your back to your side while in a flat bed without using bedrails?: None Help needed moving from lying on your back to sitting on the side of a flat bed without using bedrails?: None Help needed moving to and from a bed to a chair (including a wheelchair)?: A Little Help needed standing up from a chair using your arms (e.g., wheelchair or bedside chair)?: A Little Help needed to walk in hospital room?: A Little Help needed climbing 3-5 steps with a railing? : A Lot 6 Click Score: 19    End of Session Equipment Utilized During Treatment: Gait belt Activity Tolerance: Patient tolerated treatment well Patient left: in bed;with call bell/phone within reach;with bed alarm set Nurse Communication: Mobility status PT Visit Diagnosis: Unsteadiness on feet (R26.81);Muscle weakness (generalized) (M62.81)     Time: 3734-2876 PT Time Calculation (min) (ACUTE ONLY): 33 min  Charges:  $Gait Training: 23-37 mins                     Rolinda Roan, PT, DPT Acute Rehabilitation Services Pager: [phone] Office: [phone]    Thelma Comp 03/08/2020, 12:07 PM

## 2020-03-08 NOTE — Discharge Summary (Signed)
Physician Discharge Summary  David Cherry KGY:[id number] DOB: 20-Mar-1946 DOA: 03/03/2020  PCP: Patient, No Pcp Per  Admit date: 03/03/2020 Discharge date: 03/08/2020  Time spent: 55 minutes  Recommendations for Outpatient Follow-up:  1. Follow-up with PCP in 2 weeks.  On follow-up patient blood pressure need to be reassessed.  Patient will need referral to neurology to follow-up in 4 weeks.  Patient will need a basic metabolic profile done to follow-up on electrolytes and renal function. 2. Follow-up with primary cardiologist in [place] in 2 weeks for follow-up on bradycardia. 3. Follow-up with neurology in 4 weeks.   Discharge Diagnoses:  Principal Problem:   Stroke-like symptoms Active Problems:   CAD (coronary artery disease)   Chronic anticoagulation   Bradycardia   Sinus pause   Alcohol use   Alcohol dependence with uncomplicated withdrawal (HCC)   Discharge Condition: Stable and improved  Diet recommendation: Heart healthy  Filed Weights   03/03/20 0101  Weight: 91.5 kg    History of present illness:  HPI per Dr. Ronnie Derby is a 74 y.o. male with medical history significant of CAD s/p stent, HTN.  Pt brought in to ED after [place] [address] with confusion, aphasia, R sided weakness.  Some coumadin found in his car.  Per patient hasnt been taking med for the past month.  History from patient not really great at this point due to a mixed wernickie+broca aphasia.  For example when a word is used with the patient he will start randomly putting that word into sentences: ie "I live in coumadin".  Makes history taking very challenging.  Family doesn't know much about his medical history when ED called them.   ED Course: In ED pt seen by neurology as a code stroke, not TPAed due to unk last well.  MRI brain neg for acute stroke findings.  Per Dr. Laurence Slate consider TIA vs seizure with post-ictal state.  Hospital Course:  1 rule out  CVA/strokelike symptoms Patient presenting with right-sided weakness, right facial droop and noted to be aphasic.  Patient with improvement with aphasia as well as facial droop.  Head CT done on admission negative for any acute abnormalities.  CT angiogram head and neck which was done was negative for any large vessel occlusion.  tPA not given as patient unclear as to last known normal.  MRI which was done in intracranial process, multiple old small vessel infarcts and findings of mild chronic small vessel disease noted.  Repeat diffusion-weighted MRI done was negative. 2D echo ordered with EF of 55 to 60%, no wall motion abnormalities, grade 1 diastolic dysfunction, no source of emboli noted.  2D echo however did note a grade 2 atheroma plaque involving the aortic root and ascending aorta.  Fasting lipid panel with LDL of 53.  Neurology consulted and followed the patient throughout the hospitalization..  Patient loaded with Keppra due to concerns for possible seizure versus post ictal state.  EEG also ordered and done which showed a normal study with no seizures or epileptiform discharges seen throughout the recording.  Patient maintained on aspirin, statin.  Patient's Plavix was resumed.  Antihypertensive medications adjusted per cardiology recommendations.  Patient also maintained on IV thiamine 500 mg every 8 hours during the hospitalization.  Patient improved clinically.  Per neurology given resolution of patient's symptoms with stroke work-up negative it was felt no further neurological diagnostic testing would be needed during the hospitalization and recommended outpatient follow-up with neurology.  Patient be discharged  in stable and improved condition.  Patient was seen by PT/OT who recommended SNF however patient refused SNF and as such patient will be discharged home with home health therapies.  2.  Bradycardia/sinus pauses Patient noted on presentation to the ED to have some severe bradycardia when  asleep and less severe when patient was awake.  It was noted her heart rate would drop into the 30s while patient was asleep.  Unknown as to what specific medications patient is on in terms of a nodal blocking agents.  It was noted that when patient not having pauses heart rate went almost up to 100.  Cardiology consulted and patient seen in consultation by Dr. Algie Coffer.  2D echo was ordered with normal EF of 55 to 60%, no wall motion abnormalities, grade 1 diastolic dysfunction.  Patient noted to have 2-second sinus pauses while sleeping otherwise heart rate improved when awake as well as improvement with blood pressure control.  It was noted per cardiology patient had been off antiplatelets and anticoagulants for over 6 months and after 6 months of use.  Cardiology recommended discontinuation of patient's clonidine, Coreg.  Outpatient follow-up with his primary cardiologist in [place].   3.  Hypertension Patient's blood pressure medications were adjusted per cardiology.  Patient's Norvasc was decreased to 5 mg daily.  Patient placed on hydralazine and adjusted to 50 mg twice daily.  Patient's Coreg, clonidine, lisinopril were discontinued per cardiology recommendations.  Blood pressure remained stable.  Outpatient follow-up with PCP.   4.  Chronic anticoagulation/coronary artery disease 2D echo done with a EF of 55 to 60%, no wall motion abnormalities, grade 1 diastolic dysfunction.  Patient denies any ongoing chest pain.  It was noted that patient was on anticoagulation with Coumadin for up until a month ago however noted to have stopped taking Coumadin.  INR at 1.0.    Patient seen in consultation by cardiology due to bradycardia and sinus pauses and patient's cardiac medications adjusted.  Patient was maintained on aspirin 81 mg daily, resumed on home regimen of Plavix 75 mg daily, Norvasc decreased to 5 mg daily, patient maintained on atorvastatin 80 mg daily.  Cardiology recommended patient's Coreg,  clonidine, lisinopril and Coumadin be discontinued which they were on discharge.  Outpatient follow-up with his primary cardiologist.   5.  Elevated creatinine Creatinine noted to be approximately 1.68 on presentation.  Creatinine improved with hydration such that by day of discharge creatinine was down to 1.03.  Patient's ACE inhibitor was discontinued and would not be resumed on discharge.   6. Alcohol dependence with uncomplicated withdrawal Patient initially on presentation denied any alcohol use.  Patient when asked how much alcohol he drinks daily he states he drinks about 2-3 drinks daily that he is done for several years.  Patient states drinks mainly vodka and had to change to wine when he was placed on Coumadin.  Patient noted to have some tremors the morning of 03/06/2020 and received IV Ativan per RN.    Patient was placed on Ativan withdrawal protocol as well as the Librium detox protocol.  Patient maintained on thiamine, folic acid, multivitamin.  Supportive care.  Patient improved clinically on the Librium detox protocol and Ativan withdrawal protocol and was stable by day of discharge.   7.  Intermittent confusion Patient noted to have some intermittent confusion from time to time.  Concern patient may have a history of alcohol use however patient denied any alcohol on admission.  When asked, how many drinks he  drinks a day patient stated drinks about 2-3 drinks a day that he has done this for a long time and is mainly vodka.  Patient stated he had to change to wine when he was on Coumadin.  Patient noted to have some fine tremors the morning of 03/06/2020 per RN and received IV Ativan of the Ativan withdrawal protocol.  Folate level 21.8.  Vitamin B12 at 448.  TSH within normal limits at 3.729.  Ammonia level slightly elevated at 39.  Status post 3 days of thiamine 500 mg IV every 8 hours.  Patient placed on the Librium detox protocol as well as the Ativan withdrawal protocol with clinical  improvement.  Patient also received a dose of lactulose.  Patient was stable for discharge home with 24-hour supervision and a safe discharge plan. Follow.     Procedures:  CT head 03/03/2020  2D echo 03/03/2020  MRI 03/03/2020  Chest x-ray 03/03/2020  CT angiogram head and neck 03/03/2020  EEG 03/03/2020  Repeat diffusion imaging MRI 03/03/2020   Consultations:  Cardiology: Dr. Algie CofferKadakia 03/03/2020  Neurology: Dr.Aroor 03/03/2020  Discharge Exam: Vitals:   03/08/20 0801 03/08/20 1135  BP: 138/87 (!) 139/98  Pulse: 63 93  Resp: 18 18  Temp: 98.3 F (36.8 C) 98.6 F (37 C)  SpO2: 98% 95%    General: NAD Cardiovascular: RRR Respiratory: CTAB  Discharge Instructions   Discharge Instructions    Diet - low sodium heart healthy   Complete by: As directed    Increase activity slowly   Complete by: As directed      Allergies as of 03/08/2020   No Known Allergies     Medication List    STOP taking these medications   lisinopril 20 MG tablet Commonly known as: ZESTRIL   warfarin 5 MG tablet Commonly known as: COUMADIN     TAKE these medications   amLODipine 5 MG tablet Commonly known as: NORVASC Take 1 tablet (5 mg total) by mouth daily. What changed:   medication strength  how much to take   aspirin 81 MG EC tablet Take 1 tablet (81 mg total) by mouth daily. Swallow whole.   atorvastatin 80 MG tablet Commonly known as: LIPITOR Take 1 tablet (80 mg total) by mouth daily.   clopidogrel 75 MG tablet Commonly known as: PLAVIX Take 1 tablet (75 mg total) by mouth daily.   cyclobenzaprine 5 MG tablet Commonly known as: FLEXERIL Take 5 mg by mouth at bedtime as needed for muscle spasms.   diazepam 5 MG tablet Commonly known as: VALIUM Take 5 mg by mouth 2 (two) times daily as needed for anxiety.   donepezil 5 MG tablet Commonly known as: ARICEPT Take 5 mg by mouth daily.   famotidine 20 MG tablet Commonly known as: PEPCID Take 20 mg by mouth 2  (two) times daily.   folic acid 1 MG tablet Commonly known as: FOLVITE Take 1 tablet (1 mg total) by mouth daily. Start taking on: March 09, 2020   hydrALAZINE 50 MG tablet Commonly known as: APRESOLINE Take 1 tablet (50 mg total) by mouth 2 (two) times daily.   multivitamin with minerals Tabs tablet Take 1 tablet by mouth daily. Start taking on: March 09, 2020   potassium chloride 10 MEQ tablet Commonly known as: KLOR-CON Take 1 tablet (10 mEq total) by mouth daily.   PrePLUS 27-1 MG Tabs Take 1 mg by mouth daily.   thiamine 100 MG tablet Take 1 tablet (100 mg total)  by mouth daily.   thiamine 100 MG tablet Take 1 tablet (100 mg total) by mouth daily. Start taking on: March 09, 2020      No Known Allergies  Follow-up Information    Cardiology. Schedule an appointment as soon as possible for a visit in 2 week(s).        [facility] Follow up on 03/09/2020.   Why: Follow-up with your primary care physician scheduled for March 09, 2020 at 2:15 PM. Contact information: [address] [place], [place] [address] Phone: [phone]       [facility] Follow up on 03/11/2020.   Why: Neurology follow-up has been scheduled for March 11, 2020 at 12:15 PM. You will be seeing Dr. Tenny Craw. Directions and registration forms have been placed in your belongings bag. Contact information: [address] [place], [place] [address]       [facility]. Call.   Why: Representative from [facility] will call you to schedule start of services.  Contact information: [address] [place], [place] [address] [phone]               The results of significant diagnostics from this hospitalization (including imaging, microbiology, ancillary and laboratory) are listed below for reference.    Significant Diagnostic Studies: EEG  Result Date: 03/03/2020 David Quest, MD     03/03/2020 10:06 AM Patient Name: David Cherry  MRN: [id number] Epilepsy Attending: Charlsie Cherry Referring Physician/Provider: Dr. Georgiana Spinner Aroor Date: 03/03/2020 Duration: 25.57 minutes Patient history: 74 year old male who presented with aphasia concerning for seizures.  EEG evaluate for seizures. Level of alertness: Awake, drowsy, sleep, comatose, lethargic AEDs during EEG study: Keppra Technical aspects: This EEG study was done with scalp electrodes positioned according to the 10-20 International system of electrode placement. Electrical activity was acquired at a sampling rate of  and reviewed with a high frequency filter of  and a low frequency filter of . EEG data were recorded continuously and digitally stored. Description: The posterior dominant rhythm consists of 9 Hz activity of moderate voltage (25-35 uV) seen predominantly in posterior head regions, symmetric and reactive to eye opening and eye closing.  Hyperventilation and photic stimulation were not performed.   IMPRESSION: This study is within normal limits. No seizures or epileptiform discharges were seen throughout the recording. David Cherry   CT ANGIO HEAD W OR WO CONTRAST  Result Date: 03/03/2020 CLINICAL DATA:  Right-sided weakness EXAM: CT ANGIOGRAPHY HEAD AND NECK TECHNIQUE: Multidetector CT imaging of the head and neck was performed using the standard protocol during bolus administration of intravenous contrast. Multiplanar CT image reconstructions and MIPs were obtained to evaluate the vascular anatomy. Carotid stenosis measurements (when applicable) are obtained utilizing NASCET criteria, using the distal internal carotid diameter as the denominator. CONTRAST:  75mL OMNIPAQUE IOHEXOL 350 MG/ML SOLN COMPARISON:  None. FINDINGS: CTA NECK FINDINGS SKELETON: There is no bony spinal canal stenosis. No lytic or blastic lesion. OTHER NECK: Normal pharynx, larynx and major salivary glands. No cervical lymphadenopathy. Unremarkable thyroid gland. UPPER CHEST: No pneumothorax  or pleural effusion. No nodules or masses. AORTIC ARCH: There is no calcific atherosclerosis of the aortic arch. There is no aneurysm, dissection or hemodynamically significant stenosis of the visualized portion of the aorta. Conventional 3 vessel aortic branching pattern. The visualized proximal subclavian arteries are widely patent. RIGHT CAROTID SYSTEM: No dissection, occlusion or aneurysm. Mild atherosclerotic calcification at the carotid bifurcation without hemodynamically significant  stenosis. LEFT CAROTID SYSTEM: No dissection, occlusion or aneurysm. Mild atherosclerotic calcification at the carotid bifurcation without hemodynamically significant stenosis. VERTEBRAL ARTERIES: Codominant configuration. Both origins are clearly patent. There is no dissection, occlusion or flow-limiting stenosis to the skull base (V1-V3 segments). CTA HEAD FINDINGS POSTERIOR CIRCULATION: --Vertebral arteries: Normal V4 segments. --Inferior cerebellar arteries: Normal. --Basilar artery: Normal. --Superior cerebellar arteries: Normal. --Posterior cerebral arteries (PCA): Normal. ANTERIOR CIRCULATION: --Intracranial internal carotid arteries: Atherosclerotic calcification of the internal carotid arteries at the skull base without hemodynamically significant stenosis. --Anterior cerebral arteries (ACA): Normal. Absent right A1 segment, normal variant --Middle cerebral arteries (MCA): Normal. VENOUS SINUSES: As permitted by contrast timing, patent. ANATOMIC VARIANTS: None Review of the MIP images confirms the above findings. IMPRESSION: 1. No emergent large vessel occlusion or hemodynamically significant stenosis of the head or neck. 2. Mild bilateral carotid bifurcation atherosclerosis without hemodynamically significant stenosis. Electronically Signed   By: Deatra Robinson M.D.   On: 03/03/2020 01:30   CT ANGIO NECK W OR WO CONTRAST  Result Date: 03/03/2020 CLINICAL DATA:  Right-sided weakness EXAM: CT ANGIOGRAPHY HEAD AND NECK  TECHNIQUE: Multidetector CT imaging of the head and neck was performed using the standard protocol during bolus administration of intravenous contrast. Multiplanar CT image reconstructions and MIPs were obtained to evaluate the vascular anatomy. Carotid stenosis measurements (when applicable) are obtained utilizing NASCET criteria, using the distal internal carotid diameter as the denominator. CONTRAST:  38mL OMNIPAQUE IOHEXOL 350 MG/ML SOLN COMPARISON:  None. FINDINGS: CTA NECK FINDINGS SKELETON: There is no bony spinal canal stenosis. No lytic or blastic lesion. OTHER NECK: Normal pharynx, larynx and major salivary glands. No cervical lymphadenopathy. Unremarkable thyroid gland. UPPER CHEST: No pneumothorax or pleural effusion. No nodules or masses. AORTIC ARCH: There is no calcific atherosclerosis of the aortic arch. There is no aneurysm, dissection or hemodynamically significant stenosis of the visualized portion of the aorta. Conventional 3 vessel aortic branching pattern. The visualized proximal subclavian arteries are widely patent. RIGHT CAROTID SYSTEM: No dissection, occlusion or aneurysm. Mild atherosclerotic calcification at the carotid bifurcation without hemodynamically significant stenosis. LEFT CAROTID SYSTEM: No dissection, occlusion or aneurysm. Mild atherosclerotic calcification at the carotid bifurcation without hemodynamically significant stenosis. VERTEBRAL ARTERIES: Codominant configuration. Both origins are clearly patent. There is no dissection, occlusion or flow-limiting stenosis to the skull base (V1-V3 segments). CTA HEAD FINDINGS POSTERIOR CIRCULATION: --Vertebral arteries: Normal V4 segments. --Inferior cerebellar arteries: Normal. --Basilar artery: Normal. --Superior cerebellar arteries: Normal. --Posterior cerebral arteries (PCA): Normal. ANTERIOR CIRCULATION: --Intracranial internal carotid arteries: Atherosclerotic calcification of the internal carotid arteries at the skull base  without hemodynamically significant stenosis. --Anterior cerebral arteries (ACA): Normal. Absent right A1 segment, normal variant --Middle cerebral arteries (MCA): Normal. VENOUS SINUSES: As permitted by contrast timing, patent. ANATOMIC VARIANTS: None Review of the MIP images confirms the above findings. IMPRESSION: 1. No emergent large vessel occlusion or hemodynamically significant stenosis of the head or neck. 2. Mild bilateral carotid bifurcation atherosclerosis without hemodynamically significant stenosis. Electronically Signed   By: Deatra Robinson M.D.   On: 03/03/2020 01:30   MR BRAIN WO CONTRAST  Result Date: 03/03/2020 CLINICAL DATA:  Speech difficulty EXAM: MRI HEAD WITHOUT CONTRAST TECHNIQUE: Multiplanar, multiecho pulse sequences of the brain and surrounding structures were obtained without intravenous contrast. COMPARISON:  None. FINDINGS: Only diffusion-weighted imaging was performed. There is no evidence of acute ischemia. IMPRESSION: No evidence of acute ischemia. Electronically Signed   By: Deatra Robinson M.D.   On: 03/03/2020 20:34   MR BRAIN  WO CONTRAST  Result Date: 03/03/2020 CLINICAL DATA:  Dysphagia and right-sided weakness EXAM: MRI HEAD WITHOUT CONTRAST TECHNIQUE: Multiplanar, multiecho pulse sequences of the brain and surrounding structures were obtained without intravenous contrast. COMPARISON:  CTA head neck same day FINDINGS: BRAIN: No acute infarct, acute hemorrhage or extra-axial collection. Multifocal white matter hyperintensity, most commonly due to chronic ischemic microangiopathy. Multiple old small vessel infarcts of the radiata. Normal volume of CSF spaces. No chronic microhemorrhage. Normal midline structures. VASCULAR: Major flow voids are preserved. SKULL AND UPPER CERVICAL SPINE: Normal calvarium and skull base. Visualized upper cervical spine and soft tissues are normal. SINUSES/ORBITS: Right mastoid fluid. Paranasal sinuses are clear. Normal orbits. IMPRESSION: 1. No  acute intracranial process. 2. 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 Electronically Signed   By: Ulyses Jarred M.D.   On: 03/03/2020 02:15   DG Chest Portable 1 View  Result Date: 03/03/2020 CLINICAL DATA:  Altered mental status EXAM: PORTABLE CHEST 1 VIEW COMPARISON:  None. FINDINGS: No focal consolidation, features of edema, pneumothorax, or effusion. The aorta is calcified. The remaining cardiomediastinal contours are unremarkable. No acute osseous or soft tissue abnormality. Degenerative changes are present in the imaged spine and shoulders. Telemetry leads overlie the chest. IMPRESSION: No acute cardiopulmonary abnormality. Electronically Signed   By: Lovena Le M.D.   On: 03/03/2020 02:36   ECHOCARDIOGRAM COMPLETE  Result Date: 03/03/2020    ECHOCARDIOGRAM REPORT   Patient Name:   ROWEN WILMER Date of Exam: 03/03/2020 Medical Rec #:  [id number]     Height:       69.0 in Accession #:    [id number]    Weight:       201.7 lb Date of Birth:  December 14, 1945     BSA:          2.074 m Patient Age:    58 years      BP:           150/83 mmHg Patient Gender: M             HR:           65 bpm. Exam Location:  Inpatient Procedure: 2D Echo Indications:     TIA G45.9  History:         Patient has no prior history of Echocardiogram examinations.                  CAD; Risk Factors:Hypertension.  Sonographer:     Mikki Santee RDCS (AE) Referring Phys:  [place] Diagnosing Phys: Dixie Dials MD IMPRESSIONS  1. Left ventricular ejection fraction, by estimation, is 55 to 60%. The left ventricle has normal function. The left ventricle has no regional wall motion abnormalities. Left ventricular diastolic parameters are consistent with Grade I diastolic dysfunction (impaired relaxation). Elevated left ventricular end-diastolic pressure.  2. Right ventricular systolic function is normal. The right ventricular size is normal.  3. Left atrial size was moderately dilated.   4. Right atrial size was mildly dilated.  5. The mitral valve is normal in structure. Mild mitral valve regurgitation.  6. The aortic valve is tricuspid. Aortic valve regurgitation is not visualized. Mild aortic valve sclerosis is present, with no evidence of aortic valve stenosis.  7. There is mild (Grade II) atheroma plaque involving the aortic root and ascending aorta.  8. The inferior vena cava is normal in size with greater than 50% respiratory variability, suggesting right atrial pressure of 3 mmHg.  FINDINGS  Left Ventricle: Left ventricular ejection fraction, by estimation, is 55 to 60%. The left ventricle has normal function. The left ventricle has no regional wall motion abnormalities. The left ventricular internal cavity size was normal in size. There is  borderline left ventricular hypertrophy. Left ventricular diastolic parameters are consistent with Grade I diastolic dysfunction (impaired relaxation). Elevated left ventricular end-diastolic pressure. Right Ventricle: The right ventricular size is normal. No increase in right ventricular wall thickness. Right ventricular systolic function is normal. Left Atrium: Left atrial size was moderately dilated. Right Atrium: Right atrial size was mildly dilated. Pericardium: There is no evidence of pericardial effusion. Mitral Valve: The mitral valve is normal in structure. There is mild thickening of the mitral valve leaflet(s). There is mild calcification of the mitral valve leaflet(s). Normal mobility of the mitral valve leaflets. Mild mitral annular calcification. Mild mitral valve regurgitation. Tricuspid Valve: The tricuspid valve is normal in structure. Tricuspid valve regurgitation is mild. Aortic Valve: The aortic valve is tricuspid. . There is mild thickening and mild calcification of the aortic valve. Aortic valve regurgitation is not visualized. Mild aortic valve sclerosis is present, with no evidence of aortic valve stenosis. Mild aortic valve  annular calcification. There is mild thickening of the aortic valve. There is mild calcification of the aortic valve. Pulmonic Valve: The pulmonic valve was normal in structure. Pulmonic valve regurgitation is mild. Aorta: The aortic root is normal in size and structure. There is mild (Grade II) atheroma plaque involving the aortic root and ascending aorta. Venous: The inferior vena cava is normal in size with greater than 50% respiratory variability, suggesting right atrial pressure of 3 mmHg. IAS/Shunts: No atrial level shunt detected by color flow Doppler.  LEFT VENTRICLE PLAX 2D LVIDd:         4.00 cm  Diastology LVIDs:         2.80 cm  LV e' lateral:   10.10 cm/s LV PW:         1.10 cm  LV E/e' lateral: 5.0 LV IVS:        1.10 cm  LV e' medial:    7.72 cm/s LVOT diam:     2.40 cm  LV E/e' medial:  6.6 LV SV:         119 LV SV Index:   58 LVOT Area:     4.52 cm  RIGHT VENTRICLE RV S prime:     15.30 cm/s TAPSE (M-mode): 2.3 cm LEFT ATRIUM             Index       RIGHT ATRIUM           Index LA diam:        4.20 cm 2.03 cm/m  RA Area:     13.40 cm LA Vol (A2C):   55.9 ml 26.96 ml/m RA Volume:   26.70 ml  12.88 ml/m LA Vol (A4C):   55.0 ml 26.52 ml/m LA Biplane Vol: 56.4 ml 27.20 ml/m  AORTIC VALVE LVOT Vmax:   110.00 cm/s LVOT Vmean:  72.300 cm/s LVOT VTI:    0.264 m  AORTA Ao Root diam: 2.80 cm MITRAL VALVE MV Area (PHT): 2.73 cm    SHUNTS MV Decel Time: 278 msec    Systemic VTI:  0.26 m MV E velocity: 50.75 cm/s  Systemic Diam: 2.40 cm MV A velocity: 81.50 cm/s MV E/A ratio:  0.62 David Cobb MD Electronically signed by David Cobb MD Signature Date/Time: 03/03/2020/5:27:18 PM  Final    CT HEAD CODE STROKE WO CONTRAST  Result Date: 03/03/2020 CLINICAL DATA:  Code stroke.  Right-sided weakness and aphasia EXAM: CT HEAD WITHOUT CONTRAST TECHNIQUE: Contiguous axial images were obtained from the base of the skull through the vertex without intravenous contrast. COMPARISON:  None. FINDINGS: Brain: There  is no mass, hemorrhage or extra-axial collection. There is generalized atrophy without lobar predilection. There is hypoattenuation of the periventricular white matter, most commonly indicating chronic ischemic microangiopathy. 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 Vascular: No abnormal hyperdensity of the major intracranial arteries or dural venous sinuses. No intracranial atherosclerosis. Skull: The visualized skull base, calvarium and extracranial soft tissues are normal. Sinuses/Orbits: No fluid levels or advanced mucosal thickening of the visualized paranasal sinuses. No mastoid or middle ear effusion. The orbits are normal. ASPECTS [facility] Stroke Program Early CT Score) - Ganglionic level infarction (caudate, lentiform nuclei, internal capsule, insula, M1-M3 cortex): 7 - Supraganglionic infarction (M4-M6 cortex): 3 Total score (0-10 with 10 being normal): 10 IMPRESSION: 1. No acute hemorrhage. 2. Possible subacute left cerebellar infarct. 3. ASPECTS is 10. These results were communicated to Dr. Georgiana Spinner Aroor at 1:04 am on 03/03/2020 by text page via the [facility] messaging system. Electronically Signed   By: Deatra Robinson M.D.   On: 03/03/2020 01:04    Microbiology: Recent Results (from the past 240 hour(s))  SARS Coronavirus 2 by RT PCR (hospital order, performed in [facility] hospital lab) Nasopharyngeal Nasopharyngeal Swab     Status: None   Collection Time: 03/03/20  1:24 AM   Specimen: Nasopharyngeal Swab  Result Value Ref Range Status   SARS Coronavirus 2 NEGATIVE NEGATIVE Final    Comment: (NOTE) SARS-CoV-2 target nucleic acids are NOT DETECTED. The SARS-CoV-2 RNA is generally detectable in upper and lower respiratory specimens during the acute phase of infection. The lowest concentration of SARS-CoV-2 viral copies this assay can detect is 250 copies / mL. A negative result does not preclude SARS-CoV-2 infection and should not be used as the sole basis for treatment or other patient  management decisions.  A negative result may occur with improper specimen collection / handling, submission of specimen other than nasopharyngeal swab, presence of viral mutation(s) within the areas targeted by this assay, and inadequate number of viral copies (<250 copies / mL). A negative result must be combined with clinical observations, patient history, and epidemiological information. Fact Sheet for Patients:   BoilerBrush.com.cy Fact Sheet for Healthcare Providers: https://pope.com/ This test is not yet approved or cleared  by the [place] FDA and has been authorized for detection and/or diagnosis of SARS-CoV-2 by FDA under an Emergency Use Authorization (EUA).  This EUA will remain in effect (meaning this test can be used) for the duration of the COVID-19 declaration under Section 564(b)(1) of the Act, 21 U.S.C. section 360bbb-3(b)(1), unless the authorization is terminated or revoked sooner. Performed at [facility], [address], [place], [place] [address]      Labs: Basic Metabolic Panel: Recent Labs  Lab 03/04/20 0458 03/05/20 0720 03/06/20 0348 03/07/20 0727 03/08/20 0941  NA 141 139 142 140 139  K 3.4* 3.7 3.5 3.7 3.5  CL 108 110 108 106 105  CO2 25 23 24 26 26   GLUCOSE 98 92 104* 105* 154*  BUN 19 13 15 17 18   CREATININE 1.30* 1.09 1.10 1.14 1.03  CALCIUM 8.8* 9.2 9.8 9.5 9.5  MG  --   --  1.7 2.1  --   PHOS  --   --  4.1  --   --    Liver Function Tests: Recent Labs  Lab 03/03/20 0057 03/06/20 0411  AST 23 21  ALT 19 21  ALKPHOS 46 45  BILITOT 1.6* 1.0  PROT 7.2 6.9  ALBUMIN 4.4 4.0   No results for input(s): LIPASE, AMYLASE in the last 168 hours. Recent Labs  Lab 03/06/20 0348  AMMONIA 39*   CBC: Recent Labs  Lab 03/03/20 0057 03/03/20 0102 03/04/20 0458 03/07/20 0727  WBC 8.9  --  5.8 7.1  NEUTROABS 7.0  --   --   --   HGB 14.3 13.3 12.4* 14.5  HCT 43.2 39.0 36.5* 43.1   MCV 96.0  --  94.3 93.1  PLT 127*  --  116* 127*   Cardiac Enzymes: No results for input(s): CKTOTAL, CKMB, CKMBINDEX, TROPONINI in the last 168 hours. BNP: BNP (last 3 results) No results for input(s): BNP in the last 8760 hours.  ProBNP (last 3 results) No results for input(s): PROBNP in the last 8760 hours.  CBG: Recent Labs  Lab 03/03/20 0053  GLUCAP 133*       Signed:  Ramiro Harvest MD.  [facility] 03/08/2020, 12:33 PM

## 2020-03-08 NOTE — Progress Notes (Signed)
Patient has been discharged from unit via wheelchair. Transportation is by taxi. All IV's and Tele were discontinued. Patient reports of no pain. AVS documentation was reviewed and received. Patient sent in good spirits.

## 2020-03-08 NOTE — TOC Transition Note (Signed)
Transition of Care [facility]) - CM/SW Discharge Note   Patient Details  Name: David Cherry MRN: [id number] Date of Birth: 25-May-1946  [facility] CM/SW Contact:  Baldemar Lenis, LCSW Phone Number: 03/08/2020, 3:19 PM   Clinical Narrative:   CSW asked PT to work with patient today, said he was doing better and would be safer with mobility if stable to return home today. CSW confirmed with MD plan to return home today. Patient has already received medications, RN to obtain and send with patient. CSW confirmed with [employer] that they could provide transportation for patient to multiple stops (jail, bank, tow yard) and wait for him at each stop to take him to the next one. [employer] taxi to provide transport for patient within [place] and then [employer] to provide transportation for patient to [place]. Patient agreeable to pay for services. CSW alerted patient's friend, Joni Reining, that the patient was going home today and she will check on patient when he gets home. [facility] to start care tomorrow for home health. CSW ordered walker for patient and it was delivered to room. No further CSW needs at this time.     Final next level of care: Home w Home Health Services Barriers to Discharge: Barriers Resolved   Patient Goals and CMS Choice Patient states their goals for this hospitalization and ongoing recovery are:: Pt not agreeable to SNF, waiting for updated PT for reccomendations. CMS Medicare.gov Compare Post Acute Care list provided to:: Patient Choice offered to / list presented to : Patient  Discharge Placement                Patient to be transferred to facility by: Tow company Name of family member notified: Self Patient and family notified of of transfer: 03/08/20  Discharge Plan and Services In-house Referral: Clinical Social Work                        HH Arranged: PT, OT, Speech Therapy, Social Work [employer]: [facility] [employer]        Social Determinants of Health (SDOH) Interventions     Readmission Risk Interventions No flowsheet data found.

## 2020-10-25 IMAGING — CT CT ANGIO NECK
1 of 17 series · 3 of 34 positions shown · IV contrast (omnipaque)
Comparison: None.

CLINICAL DATA: Right-sided weakness

EXAM:
CT ANGIOGRAPHY HEAD AND NECK
TECHNIQUE: Multidetector CT imaging of the head and neck was performed using
the standard protocol during bolus administration of intravenous
contrast. Multiplanar CT image reconstructions and MIPs were
obtained to evaluate the vascular anatomy. Carotid stenosis
measurements (when applicable) are obtained utilizing NASCET
criteria, using the distal internal carotid diameter as the
denominator.
CONTRAST:  75mL OMNIPAQUE IOHEXOL 350 MG/ML SOLN

[Series 7: ax thin · axial · 0.39mm/px · z∈[-259,+82]mm · 3 of 343 slices shown]
[im 1/343  soft-tissue]
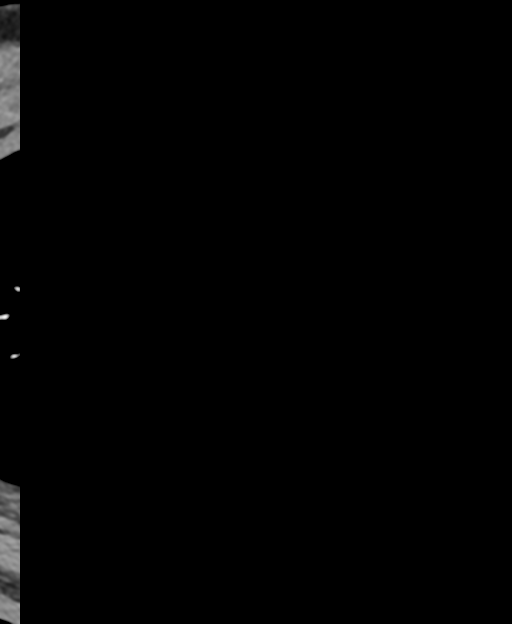
[im 172/343  bone]
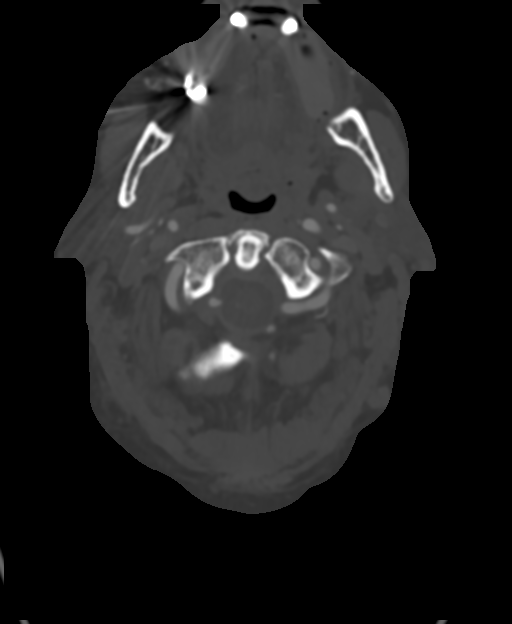
[im 343/343  soft-tissue]
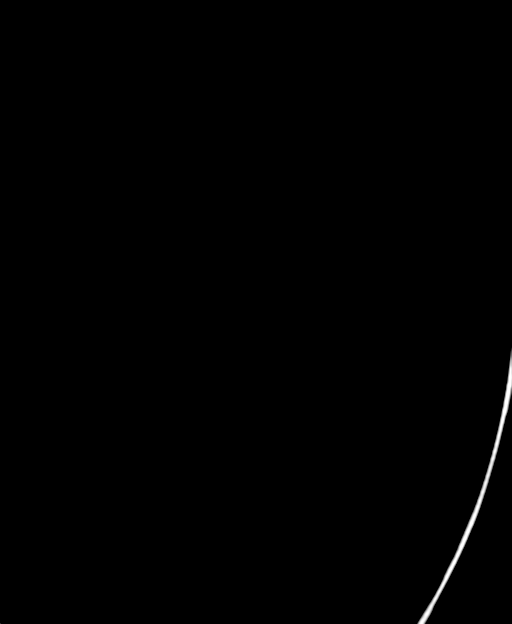

[3 of 34 positions shown; findings below may reference images not displayed]

FINDINGS: CTA NECK FINDINGS

SKELETON: There is no bony spinal canal stenosis. No lytic or
blastic lesion.

OTHER NECK: Normal pharynx, larynx and major salivary glands. No
cervical lymphadenopathy. Unremarkable thyroid gland.

UPPER CHEST: No pneumothorax or pleural effusion. No nodules or
masses.

AORTIC ARCH:

There is no calcific atherosclerosis of the aortic arch. There is no
aneurysm, dissection or hemodynamically significant stenosis of the
visualized portion of the aorta. Conventional 3 vessel aortic
branching pattern. The visualized proximal subclavian arteries are
widely patent.

RIGHT CAROTID SYSTEM: No dissection, occlusion or aneurysm. Mild
atherosclerotic calcification at the carotid bifurcation without
hemodynamically significant stenosis.

LEFT CAROTID SYSTEM: No dissection, occlusion or aneurysm. Mild
atherosclerotic calcification at the carotid bifurcation without
hemodynamically significant stenosis.

VERTEBRAL ARTERIES: Codominant configuration. Both origins are
clearly patent. There is no dissection, occlusion or flow-limiting
stenosis to the skull base (V1-V3 segments).

CTA HEAD FINDINGS

POSTERIOR CIRCULATION:

--Vertebral arteries: Normal V4 segments.

--Inferior cerebellar arteries: Normal.

--Basilar artery: Normal.

--Superior cerebellar arteries: Normal.

--Posterior cerebral arteries (PCA): Normal.

ANTERIOR CIRCULATION:

--Intracranial internal carotid arteries: Atherosclerotic
calcification of the internal carotid arteries at the skull base
without hemodynamically significant stenosis.

--Anterior cerebral arteries (ACA): Normal. Absent right A1 segment,
normal variant

--Middle cerebral arteries (MCA): Normal.

VENOUS SINUSES: As permitted by contrast timing, patent.

ANATOMIC VARIANTS: None

Review of the MIP images confirms the above findings.
IMPRESSION: 1. No emergent large vessel occlusion or hemodynamically significant
stenosis of the head or neck.
2. Mild bilateral carotid bifurcation atherosclerosis without
hemodynamically significant stenosis.
